# Patient Record
Sex: Female | Born: 1970 | Race: White | Hispanic: No | Marital: Married | Smoking: Former smoker
Health system: Southern US, Community
[De-identification: ages and names within clinical notes are randomized; demographics above are authoritative.]

## PROBLEM LIST (undated history)

## (undated) DIAGNOSIS — K5790 Diverticulosis of intestine, part unspecified, without perforation or abscess without bleeding: Secondary | ICD-10-CM

## (undated) DIAGNOSIS — F329 Major depressive disorder, single episode, unspecified: Secondary | ICD-10-CM

## (undated) DIAGNOSIS — U071 COVID-19: Secondary | ICD-10-CM

## (undated) DIAGNOSIS — K8020 Calculus of gallbladder without cholecystitis without obstruction: Secondary | ICD-10-CM

## (undated) DIAGNOSIS — F419 Anxiety disorder, unspecified: Secondary | ICD-10-CM

## (undated) DIAGNOSIS — J1282 Pneumonia due to coronavirus disease 2019: Secondary | ICD-10-CM

## (undated) DIAGNOSIS — F32A Depression, unspecified: Secondary | ICD-10-CM

## (undated) HISTORY — PX: TUBAL LIGATION: SHX77

## (undated) HISTORY — DX: Depression, unspecified: F32.A

## (undated) HISTORY — DX: Calculus of gallbladder without cholecystitis without obstruction: K80.20

## (undated) HISTORY — PX: TONSILLECTOMY: SUR1361

## (undated) HISTORY — DX: Diverticulosis of intestine, part unspecified, without perforation or abscess without bleeding: K57.90

---
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
## 2001-02-26 ENCOUNTER — Other Ambulatory Visit: Admission: RE | Admit: 2001-02-26 | Discharge: 2001-02-26 | Payer: Self-pay | Admitting: *Deleted

## 2001-04-01 ENCOUNTER — Encounter: Payer: Self-pay | Admitting: *Deleted

## 2001-04-01 ENCOUNTER — Ambulatory Visit (HOSPITAL_COMMUNITY): Admission: RE | Admit: 2001-04-01 | Discharge: 2001-04-01 | Payer: Self-pay | Admitting: *Deleted

## 2001-05-23 ENCOUNTER — Ambulatory Visit (HOSPITAL_COMMUNITY): Admission: RE | Admit: 2001-05-23 | Discharge: 2001-05-23 | Payer: Self-pay | Admitting: *Deleted

## 2001-05-23 ENCOUNTER — Encounter: Payer: Self-pay | Admitting: *Deleted

## 2001-08-27 ENCOUNTER — Inpatient Hospital Stay (HOSPITAL_COMMUNITY): Admission: AD | Admit: 2001-08-27 | Discharge: 2001-08-29 | Payer: Self-pay | Admitting: *Deleted

## 2007-05-26 ENCOUNTER — Emergency Department (HOSPITAL_COMMUNITY): Admission: EM | Admit: 2007-05-26 | Discharge: 2007-05-26 | Payer: Self-pay | Admitting: Emergency Medicine

## 2008-11-11 ENCOUNTER — Ambulatory Visit (HOSPITAL_COMMUNITY): Admission: RE | Admit: 2008-11-11 | Discharge: 2008-11-11 | Payer: Self-pay | Admitting: Obstetrics and Gynecology

## 2010-08-24 ENCOUNTER — Emergency Department (HOSPITAL_COMMUNITY): Payer: 59

## 2010-08-24 ENCOUNTER — Emergency Department (HOSPITAL_COMMUNITY)
Admission: EM | Admit: 2010-08-24 | Discharge: 2010-08-24 | Disposition: A | Discharge status: Home / Self Care | Payer: 59 | Attending: Emergency Medicine | Admitting: Emergency Medicine

## 2010-08-24 DIAGNOSIS — M5412 Radiculopathy, cervical region: Secondary | ICD-10-CM | POA: Insufficient documentation

## 2010-09-11 ENCOUNTER — Other Ambulatory Visit: Payer: Self-pay | Admitting: Family Medicine

## 2010-09-11 DIAGNOSIS — Z1231 Encounter for screening mammogram for malignant neoplasm of breast: Secondary | ICD-10-CM

## 2010-09-15 ENCOUNTER — Ambulatory Visit: Payer: 59

## 2010-09-20 ENCOUNTER — Emergency Department (HOSPITAL_COMMUNITY)
Admission: EM | Admit: 2010-09-20 | Discharge: 2010-09-20 | Disposition: A | Discharge status: Home / Self Care | Payer: 59 | Attending: Emergency Medicine | Admitting: Emergency Medicine

## 2010-09-20 DIAGNOSIS — M5412 Radiculopathy, cervical region: Secondary | ICD-10-CM | POA: Insufficient documentation

## 2010-09-20 DIAGNOSIS — M79609 Pain in unspecified limb: Secondary | ICD-10-CM | POA: Insufficient documentation

## 2010-09-20 LAB — BASIC METABOLIC PANEL
BUN: 8 mg/dL (ref 6–23)
CO2: 26 mEq/L (ref 19–32)
Calcium: 9.2 mg/dL (ref 8.4–10.5)
Chloride: 102 mEq/L (ref 96–112)
Creatinine, Ser: 0.59 mg/dL (ref 0.4–1.2)
GFR calc Af Amer: >60 mL/min (ref >60)
GFR calc non Af Amer: >60 mL/min (ref >60)
Glucose, Bld: 83 mg/dL (ref 70–99)
Potassium: 4.3 mEq/L (ref 3.5–5.1)
Sodium: 137 mEq/L (ref 135–145)

## 2010-10-03 LAB — COMPREHENSIVE METABOLIC PANEL
BUN: 8 mg/dL (ref 6–23)
CO2: 27 mEq/L (ref 19–32)
Calcium: 9.6 mg/dL (ref 8.4–10.5)
Chloride: 103 mEq/L (ref 96–112)
Creatinine, Ser: 0.54 mg/dL (ref 0.4–1.2)
GFR calc non Af Amer: >60 mL/min (ref >60)
Glucose, Bld: 94 mg/dL (ref 70–99)
Total Bilirubin: 0.6 mg/dL (ref 0.3–1.2)

## 2010-10-03 LAB — HCG, QUANTITATIVE, PREGNANCY: hCG, Beta Chain, Quant, S: <2 m[IU]/mL (ref <5)

## 2010-10-03 LAB — CBC
HCT: 38.7 % (ref 36.0–46.0)
MCHC: 36.4 g/dL — ABNORMAL HIGH (ref 30.0–36.0)
MCV: 87.6 fL (ref 78.0–100.0)
RBC: 4.42 MIL/uL (ref 3.87–5.11)
WBC: 6.4 10*3/uL (ref 4.0–10.5)

## 2010-11-07 NOTE — Op Note (Signed)
[name], [name]:  [id number]   MEDICAL RECORD NO.:  [id number]          PATIENT TYPE:  AMB   LOCATION:  DAY                           FACILITY:  [facility]   PHYSICIAN:  Tilda Burrow, M.D. DATE OF BIRTH:  [birth year]   DATE OF PROCEDURE:  11/11/2008  DATE OF DISCHARGE:  11/11/2008                               OPERATIVE REPORT   PREOPERATIVE DIAGNOSIS:  Elective sterilization.   POSTOPERATIVE DIAGNOSIS:  Elective sterilization.   PROCEDURE:  Laparoscopic tubal sterilization with Falope rings.   SURGEON:  Christin Bach, M.D.   ASSISTANTOrson Aloe, CST.   ANESTHESIA:  General.   COMPLICATIONS:  none.   FINDINGS:  Normal tubes, bilateral small simple cysts on the right ovary  considered normal.   DETAILS OF PROCEDURE:  The patient was taken to the operating room,  prepped and draped in the usual standard fashion with legs in low  lithotomy leg supports after general anesthesia was introduced without  difficulty.  The bladder was in-and-out catheterized and Hulka tenaculum  attached to the cervix for uterine manipulation.  An infraumbilical,  vertical, 1-cm skin incision was made as well as a transverse suprapubic  1-cm incision.  A Veress needle was used to achieve pneumoperitoneum  through the umbilical incision while being careful to orient the needle  toward the pelvis while elevating the abdominal wall by manual  elevation.  Water droplet test was used to confirm intraperitoneal  placement.   Pneumoperitoneum was achieved easily under 8-to-10 mm of intra-abdominal  pressure; and the laparoscopic trocar was introduced, a 5-mm blunt  tipped trocar, under direct visualization using the video camera.  Peritoneal cavity was entered without difficulty.  Inspection of the  anterior surfaces of the abdominal contents showed no evidence of injury  or bleeding.  Attention was directed to the pelvis.  Findings were as  described above.   Attention  was first directed to the left fallopian tube which was  elevated, identified to its fimbriated end and grasped in its midportion  with Falope ring applier.  Falope ring applied and then the tube  infiltrated with Marcaine solution 0.25% using a 22-gauge spinal needle  percutaneously applied.   Attention was then directed to the right fallopian tube where a similar  procedure was performed.  Photo documentation of the ring placements was  performed; 120 cc of saline was instilled into the abdomen; deflation of  CO2 performed; instruments removed and subcuticular 4-0 Dexon closure of  skin incisions performed.  The rest of  the surgical instruments were removed; Steri-Strips placed.  The patient  allowed to awaken and go to recovery room in standard fashion.   The patient desires and was given a note allowing her to return to work  in full duty on Nov 14, 2008.      Tilda Burrow, M.D.  Electronically Signed     JVF/MEDQ  D:  11/11/2008  T:  11/12/2008  Job:  [id number]

## 2010-11-10 NOTE — H&P (Signed)
[facility]  Patient:    Kristin Sanders, Kristin Sanders Visit Number: [id number] MRN: [id number]          Service Type: OBS Location: 4A A427 01 Attending Physician:  Jeri Cos. Dictated by:   Langley Gauss, M.D. Admit Date:  08/27/2001 Discharge Date: 08/29/2001   CC:         Dr. Clarisse G[facility]   History and Physical  HISTORY OF PRESENT ILLNESS:  This is a 40 year old gravida 3, para 2 at 57 1/[redacted] weeks gestation who is admitted for induction of labor secondary to psychosocial factors. Apparently the patients husband has just been offered a full-time position, will be in work on September 01, 2001, thus, planned delivery date is scheduled at this time so that the husband may accompany the wife during the course of labor and not have any missed days of work. The patients prenatal course has been complicated only by findings of abnormal 1-hour GGT screening test which was slightly elevated at one hour at 143. A 3-hour GGT thereafter was performed which, however, was noted to be within the normal limits. The patient has had serial ultrasounds which have documented the adequate fetal growth. Initial ultrasound at [redacted] weeks gestation revealed a low lying placenta but a followup ultrasound at [redacted] weeks gestation revealed no evidence of any placenta previa.  PAST MEDICAL HISTORY:  Pertinent for cryosurgery of the cervix in 1988. The patient has had subsequently normal Pap smears since that point in time.  OBSTETRICAL HISTORY:  Pertinent for 1989 vaginal delivery, 5 pound 2 ounce female infant after a 15 hour labor. April of 1993 vaginal delivery at [facility] of a 8 pound 0 ounce female infant after 24 hours of labor. Both of these labor and deliveries were associated with only a 30 minute second stage of labor. The patient denies any problems with the labor or delivery itself.  ALLERGIES:  CODEINE which gives her nausea and  vomiting.  SOCIAL HISTORY:  The patient is not employed outside the home. She does function as a homemaker. Her husband, Onalee Hua, previously was employed by [employer]. The patient is a nonsmoker. She plans on breast-feeding. She would like to utilize Dr. Gabriel Rung from our [facility] for newborn pediatric care. The patient would like to utilize sterilization as a permanent form of birth control. She has been counseled and signed the 30 day waiting papers on May 20, 2001.  OTHER PRENATAL LABS:  O-positive blood type. Negative antibody screen, RPR is nonreactive, hepatitis B is negative.  PHYSICAL EXAMINATION:  GENERAL:  Pleasant and white female.  VITAL SIGNS:  Height 5 feet 6 inches. Prepregnancy weight of 184. Most recent weight is 238. Blood pressure 118/82, pulse rate of 80, respiratory rate is 20.  HEENT:  Negative. No adenopathy.  NECK:  Supple. Thyroid is nonpalpable.  LUNGS:  Clear.  CARDIOVASCULAR:  Regular rate and rhythm.  ABDOMEN:  Soft and nontender. No surgical scars are identified. The patient is known to be a vertex presentation by Leopolds maneuvers.  EXTREMITIES:  Known to be normal.  PELVIC:  Normal external genitalia with no lesions or ulcerations identified. No evidence of any vaginal bleeding, leakage of fluid, or abnormal discharge identified. A sterile digital examination reveals cervix to be 2 cm dilated, 60% effaced, very soft cervix at mid position. Vertex is at -2 station, but it is well applied to the cervix. Fetal heart tones were auscultated in the 150s.  ASSESSMENT:  Term intrauterine pregnancy with very friable cervix. Due to psychosocial factors the patient will be admitted for induction of labor on August 27, 2001, after obtaining a reactive nonstress test will proceed with amniotomy. Thereafter, Pitocin will be initiated if indicated for either induction or augmentation of the labor. Dictated by:   Langley Gauss, M.D. Attending Physician:  Jeri Cos. DD:  08/26/01 TD:  08/26/01 Job: [id number]

## 2010-11-10 NOTE — Group Therapy Note (Signed)
[facility]  Patient:    Kristin Sanders, Kristin Sanders Visit Number: [id number] MRN: [id number]          Service Type: OBS Location: 4A A427 01 Attending Physician:  Jeri Cos. Dictated by:   Langley Gauss, M.D. Proc. Date: 08/27/01 Admit Date:  08/27/2001 Discharge Date: 08/29/2001                               Progress Note  Kristin Sanders is admitted on todays date for induction of labor.  External fetal monitor reveals a reassuring fetal heart rate with no decelerations noted.  There is good long-term variability as well as fetal heart rate accelerations noted.  Patient complained of only a few uterine contractions during the evening.  On external fetal monitor she exhibits no uterine activity, although there is a normal uterine tone upon examination.  Cervix examination is 2 cm dilated, -1 station with a vertex well applied to the cervix.  Cervix is 70% effaced.  Initial attempt at placing the fetal scalp electrode and rupture of membranes is unsuccessful.  On the second attempt fetal scalp electrode is successfully placed with the resultant rupture of membranes and findings of clear amniotic fluid.  Patient is connected to the fetal monitor which continues to reveal a reassuring fetal heart rate.  ASSESSMENT:  Gravida 3, para 2, two prior vaginal deliveries, though patient states they were very long labors as long as 25 hours in duration.  It is apparent from her coach that patient was in prodromal labor for a long period of time and upon reaching the active phase of labor at 4 cm dilatation she progressed very rapidly over the next several hours to complete dilatation and did, in fact, have a short second stage of labor maybe 30 minutes duration. There were no complications with either delivery.  PLAN:  Manage the patient expectantly to observe for the onset of spontaneous labor.  If spontaneous labor does not ensue will augment or induce  with Pitocin as clinically indicated. Dictated by:   Langley Gauss, M.D. Attending Physician:  Jeri Cos. DD:  08/27/01 TD:  08/27/01 Job: [id number]

## 2010-11-10 NOTE — Op Note (Signed)
[facility]  Patient:    Kristin Sanders, Kristin Sanders Visit Number: [id number] MRN: [id number]          Service Type: OBS Location: 4A A427 01 Attending Physician:  Jeri Cos. Dictated by:   Langley Gauss, M.D. Proc. Date: 08/27/01 Admit Date:  08/27/2001   CC:         Dr. ________________   Operative Report  DELIVERY NOTE  DIAGNOSIS:  Thirty-nine-week intrauterine pregnancy for induction of labor.  PROCEDURE: 1. Delivery from spontaneously assisted vaginal delivery, 7 pound 8 ounce    female infant. 2. Midline episiotomy and repair.  SURGEON:  Langley Gauss, M.D.  ESTIMATED BLOOD LOSS:  Less than 500 cc.  COMPLICATIONS:  None.  SPECIMENS:  Arterial cord gas and cord blood to pathology.  The placenta was examined and noted to be apparently intact with a three vessel umbilical cord.  ANALGESIA FOR DELIVERY:  The patient did receive a single dose of 10 mg of IV Nubain during the course of labor.  At the time of delivery, 20 cc of 1% lidocaine was injected in the midline of the perineal body prior to performing episiotomy.  DESCRIPTION OF PROCEDURE:  The patient was admitted for induction of labor. On initial examination, the cervix was 2 cm dilated.  Fetal scalp electrode was placed with a resultant rupture of membranes and findings of clear amniotic fluid.  The fetal scalp electrode documented a reassuring fetal heart rate throughout the course of labor.  The patient did require Pitocin induction.  With onset of uncomfortable uterine contractions and Pitocin at 22 ml per minute, the nursing staff requested placement of intrauterine pressure catheter.  IUPC was placed without difficulty.  The cervix at that time was noted to be 6 cm dilated.  The IUPC documented uterine contractions every three minutes, 50-60 mmHg per contraction with a continuing reassuring fetal heart rate.  The patient did receive a single dose of 10 mg of IV Nubain.  Thereafter the patient progressed rapidly along the labor curve to complete dilatation and had a strong urge to push.  The patient was delivered in the birthing bed in the low lithotomy position.  With distention of the perineum, 20% of 1% lidocaine injected into the midline of the peroneal body. A small midline episiotomy was performed.  The infant was then delivered in a direct OA position over this midline episiotomy without extension.  After delivery of the head, mouth and nares of the infant were bulb suctioned of clear amniotic fluid.  A nuchal cord x1 without compression was reduced prior to delivery of the shoulder.  Spontaneous dilatation occurred through a left anterior shoulder position and gentle abdominal contraction combined with expulsive efforts resulted in delivery of the shoulder over the pubic symphysis without difficulty.  The remainder of the infants body also delivered very easily without difficulty.  The umbilical cord was then moved toward the infant.  The cord was doubly clamped and cut and the infant was handed to the nursing staff for immediate assessment.  Arterial cord gas and arterial blood are then obtained from the placenta.  Gentle traction on the nuchal cord resulted in separation which upon examination appears to be intact placenta with a three vessel umbilical cord.  Examination revealed no lacerations.  The midline episiotomy has not extended.  This is easily repaired using 0 chromic in a running locked fashion in the vaginal mucosa followed by a two layer closure of 0 chromic on the perineal body.  Both mother and infant are doing very well following delivery.  The mother was taken out of the dorsal lithotomy position and allowed to bond with the infant.  The patient does plan on breast feeding.  Pediatric care to be performed by Dr. _____ of MRSA.  She would also like infant circumcision. Dictated by:   Langley Gauss, M.D. Attending Physician:   Jeri Cos. DD:  08/27/01 TD:  08/28/01 Job: [id number]

## 2010-11-10 NOTE — Discharge Summary (Signed)
[facility]  Patient:    Kristin Sanders, Kristin Sanders Visit Number: [id number] MRN: [id number]          Service Type: OBS Location: 4A A427 01 Attending Physician:  Jeri Cos. Dictated by:   Langley Gauss, M.D. Admit Date:  08/27/2001 Discharge Date: 08/29/2001                             Discharge Summary  DIAGNOSIS:  Term pregnancy.  PROCEDURE:  Spontaneous assisted vaginal delivery, 7 pound 13 ounce female infant delivered over an episiotomy with a repair.  Analgesia for delivery is IV Nubain only. The patient is breast-feeding at time of discharge. She is given a copy of standardized discharge instructions. Pediatric care provided by Dr. Gabriel Rung.  PERTINENT LABORATORY DATA:  O-positive blood type. Admission hemoglobin and hematocrit 12.7/36.8 with a white count of 8.0. Postpartum day #1 hemoglobin 11.1, hematocrit 31.2.  HOSPITAL COURSE:  See previous dictating. The patient did well postpartum. She had no postpartum complications, bonded well with the infant, and was breast-feeding without difficulty. Both mother and infant discharged to home on postpartum day #1 1/2 with infant circumcision being performed on day of discharge. Dictated by:   Langley Gauss, M.D. Attending Physician:  Jeri Cos. DD:  09/01/01 TD:  09/02/01 Job: [id number]

## 2015-02-09 ENCOUNTER — Emergency Department (HOSPITAL_COMMUNITY): Payer: Commercial Managed Care - PPO

## 2015-02-09 ENCOUNTER — Encounter (HOSPITAL_COMMUNITY): Payer: Self-pay

## 2015-02-09 ENCOUNTER — Emergency Department (HOSPITAL_COMMUNITY)
Admission: EM | Admit: 2015-02-09 | Discharge: 2015-02-09 | Disposition: A | Discharge status: Home / Self Care | Payer: Commercial Managed Care - PPO | Attending: Physician Assistant | Admitting: Physician Assistant

## 2015-02-09 DIAGNOSIS — Z720 Tobacco use: Secondary | ICD-10-CM | POA: Insufficient documentation

## 2015-02-09 DIAGNOSIS — R197 Diarrhea, unspecified: Secondary | ICD-10-CM | POA: Diagnosis not present

## 2015-02-09 DIAGNOSIS — J209 Acute bronchitis, unspecified: Secondary | ICD-10-CM | POA: Insufficient documentation

## 2015-02-09 DIAGNOSIS — J3489 Other specified disorders of nose and nasal sinuses: Secondary | ICD-10-CM | POA: Insufficient documentation

## 2015-02-09 DIAGNOSIS — J40 Bronchitis, not specified as acute or chronic: Secondary | ICD-10-CM

## 2015-02-09 DIAGNOSIS — H9209 Otalgia, unspecified ear: Secondary | ICD-10-CM | POA: Diagnosis not present

## 2015-02-09 DIAGNOSIS — R05 Cough: Secondary | ICD-10-CM | POA: Diagnosis present

## 2015-02-09 MED ORDER — AZITHROMYCIN 250 MG PO TABS
500.0000 mg | ORAL_TABLET | Freq: Once | ORAL | Status: AC
Start: 2015-02-09 — End: 2015-02-09
  Administered 2015-02-09: 500 mg via ORAL
  Filled 2015-02-09: qty 2

## 2015-02-09 MED ORDER — HYDROCOD POLST-CPM POLST ER 10-8 MG/5ML PO SUER: 5.0000 mL | Freq: Every evening | ORAL | Status: DC | PRN

## 2015-02-09 MED ORDER — HYDROCOD POLST-CPM POLST ER 10-8 MG/5ML PO SUER
5.0000 mL | Freq: Once | ORAL | Status: AC
  Administered 2015-02-09: 5 mL via ORAL
  Filled 2015-02-09: qty 5

## 2015-02-09 MED ORDER — AZITHROMYCIN 250 MG PO TABS: ORAL_TABLET | ORAL | Status: DC

## 2015-02-09 MED ORDER — ALBUTEROL SULFATE HFA 108 (90 BASE) MCG/ACT IN AERS
2.0000 | INHALATION_SPRAY | RESPIRATORY_TRACT | Status: DC | PRN
  Administered 2015-02-09: 2 via RESPIRATORY_TRACT
  Filled 2015-02-09: qty 6.7

## 2015-02-09 NOTE — ED Notes (Signed)
Pt reports scratchy throat, cough, congestion for the past week.  Has been taking otc cough medications without relief.

## 2015-02-09 NOTE — Discharge Instructions (Signed)
Metered Dose Inhaler (No Spacer Used)  Inhaled medicines are the basis of treatment for asthma and other breathing problems. Inhaled medicine can only be effective if used properly. Good technique assures that the medicine reaches the lungs.  Metered dose inhalers (MDIs) are used to deliver a variety of inhaled medicines. These include quick relief or rescue medicines (such as bronchodilators) and controller medicines (such as corticosteroids). The medicine is delivered by pushing down on a metal canister to release a set amount of spray.  If you are using different kinds of inhalers, use your quick relief medicine to open the airways 10-15 minutes before using a steroid, if instructed to do so by your health care provider. If you are unsure which inhalers to use and the order of using them, ask your health care provider, nurse, or respiratory therapist.  HOW TO USE THE INHALER  1. Remove the cap from the inhaler.  2. If you are using the inhaler for the first time, you will need to prime it. Shake the inhaler for 5 seconds and release four puffs into the air, away from your face. Ask your health care provider or pharmacist if you have questions about priming your inhaler.  3. Shake the inhaler for 5 seconds before each breath in (inhalation).  4. Position the inhaler so that the top of the canister faces up.  5. Put your index finger on the top of the medicine canister. Your thumb supports the bottom of the inhaler.  6. Open your mouth.  7. Either place the inhaler between your teeth and place your lips tightly around the mouthpiece, or hold the inhaler 1-2 inches away from your open mouth. If you are unsure of which technique to use, ask your health care provider.  8. Breathe out (exhale) normally and as completely as possible.  9. Press the canister down with the index finger to release the medicine.  10. At the same time as the canister is pressed, inhale deeply and slowly until your lungs are completely filled.  This should take 4-6 seconds. Keep your tongue down.  11. Hold the medicine in your lungs for 5-10 seconds (10 seconds is best). This helps the medicine get into the small airways of your lungs.  12. Breathe out slowly, through pursed lips. Whistling is an example of pursed lips.  13. Wait at least 1 minute between puffs. Continue with the above steps until you have taken the number of puffs your health care provider has ordered. Do not use the inhaler more than your health care provider directs you to.  14. Replace the cap on the inhaler.  15. Follow the directions from your health care provider or the inhaler insert for cleaning the inhaler.  If you are using a steroid inhaler, after your last puff, rinse your mouth with water, gargle, and spit out the water. Do not swallow the water.  AVOID:  · Inhaling before or after starting the spray of medicine. It takes practice to coordinate your breathing with triggering the spray.  · Inhaling through the nose (rather than the mouth) when triggering the spray.  HOW TO DETERMINE IF YOUR INHALER IS FULL OR NEARLY EMPTY  You cannot know when an inhaler is empty by shaking it. Some inhalers are now being made with dose counters. Ask your health care provider for a prescription that has a dose counter if you feel you need that extra help. If your inhaler does not have a counter, ask your health care   provider to help you determine the date you need to refill your inhaler. Write the refill date on a calendar or your inhaler canister. Refill your inhaler 7-10 days before it runs out. Be sure to keep an adequate supply of medicine. This includes making sure it has not expired, and making sure you have a spare inhaler.  SEEK MEDICAL CARE IF:  · Symptoms are only partially relieved with your inhaler.  · You are having trouble using your inhaler.  · You experience an increase in phlegm.  SEEK IMMEDIATE MEDICAL CARE IF:  · You feel little or no relief with your inhalers. You are still  wheezing and feeling shortness of breath, tightness in your chest, or both.  · You have dizziness, headaches, or a fast heart rate.  · You have chills, fever, or night sweats.  · There is a noticeable increase in phlegm production, or there is blood in the phlegm.  MAKE SURE YOU:  · Understand these instructions.  · Will watch your condition.  · Will get help right away if you are not doing well or get worse.  Document Released: 04/08/2007 Document Revised: 10/26/2013 Document Reviewed: 11/27/2012  Exit[employer]® Patient Information ©2015 [employer]. This information is not intended to replace advice given to you by your health care provider. Make sure you discuss any questions you have with your health care provider.

## 2015-02-09 NOTE — ED Provider Notes (Signed)
CSN: [id number]     Arrival date & time 02/09/15  1831 History   First MD Initiated Contact with Patient 02/09/15 1903     Chief Complaint  Patient presents with  . Cough  . Sore Throat     (Consider location/radiation/quality/duration/timing/severity/associated sxs/prior Treatment) Patient is a 44 y.o. female presenting with cough and pharyngitis. The history is provided by the patient.  Cough Cough characteristics:  Productive Sputum characteristics:  Yellow Severity:  Moderate Onset quality:  Gradual Duration:  1 week Timing:  Constant Progression:  Worsening Chronicity:  New Smoker: yes   Relieved by:  Nothing Worsened by:  Lying down Ineffective treatments: OTC cough and congestion med. Associated symptoms: ear pain, myalgias, sinus congestion and sore throat   Sore Throat Associated symptoms include coughing, myalgias and a sore throat.   Kristin Sanders is a 44 y.o. female who is an every day smoker presents to the ED with cough, congestion and sore throat that started a week ago. She states that she has tried to cut back on her smoking but it seem to make the cough worse.   History reviewed. No pertinent past medical history. Past Surgical History  Procedure Laterality Date  . Tonsillectomy     No family history on file. Social History  Substance Use Topics  . Smoking status: Current Every Day Smoker  . Smokeless tobacco: None  . Alcohol Use: Yes     Comment: occ   OB History    No data available     Review of Systems  HENT: Positive for ear pain and sore throat.   Respiratory: Positive for cough.   Gastrointestinal: Diarrhea: loose stools.  Musculoskeletal: Positive for myalgias.  all other systems negative    Allergies  Codeine  Home Medications   Prior to Admission medications   Medication Sig Start Date End Date Taking? Authorizing Provider  azithromycin (ZITHROMAX) 250 MG tablet Take one tablet po daily 02/09/15   Natashia Roseman Bunnie Pion, NP   chlorpheniramine-HYDROcodone [facility] ER) 10-8 MG/5ML SUER Take 5 mLs by mouth at bedtime as needed for cough. 02/09/15   Holley Wirt Bunnie Pion, NP   BP 114/78 mmHg  Pulse 76  Temp(Src) 98.4 F (36.9 C) (Oral)  Resp 20  Ht 5\' 6"  (1.676 m)  Wt 180 lb (81.647 kg)  BMI 29.07 kg/m2  SpO2 100%  LMP 01/18/2015 Physical Exam  Constitutional: She is oriented to person, place, and time. She appears well-developed and well-nourished. No distress.  HENT:  Head: Normocephalic and atraumatic.  Right Ear: Tympanic membrane normal.  Left Ear: Tympanic membrane normal.  Nose: Rhinorrhea present.  Mouth/Throat: Uvula is midline, oropharynx is clear and moist and mucous membranes are normal.  Eyes: EOM are normal.  Neck: Normal range of motion. Neck supple.  Cardiovascular: Normal rate and regular rhythm.   Pulmonary/Chest: Effort normal. No respiratory distress. She has wheezes. She has no rales.  Abdominal: Soft. There is no tenderness.  Musculoskeletal: Normal range of motion.  Neurological: She is alert and oriented to person, place, and time. No cranial nerve deficit.  Skin: Skin is warm and dry.  Psychiatric: She has a normal mood and affect. Her behavior is normal.  Nursing note and vitals reviewed.   ED Course  Procedures (including critical care time) Labs Review Labs Reviewed - No data to display  Imaging Review Dg Chest 2 View  02/09/2015   CLINICAL DATA:  Cough, congestion, shortness of breath  EXAM: CHEST  2 VIEW  COMPARISON:  None.  FINDINGS: The heart size and mediastinal contours are within normal limits. Both lungs are clear. The visualized skeletal structures are unremarkable.  IMPRESSION: No active cardiopulmonary disease.   Electronically Signed   By: Kathreen Devoid   On: 02/09/2015 19:58   I have personally reviewed and evaluated these images and results as part of my medical decision-making.   MDM  44 y.o. female with cough and congestion for the past week. Stable  for d/c without pneumonia on x-ray and O2 SAT 100% on R/A. Will treat for bronchitis and she will follow up with her PCP or return here as needed for worsening symptoms. Patient given Albuterol Inhaler with instructions per respiratory. Z-pack with first dose given here and Tussionex. Discussed with the patient and all questioned fully answered. .   Final diagnoses:  Bronchitis        Ashley Murrain, NP 02/09/15 2035  Courteney Julio Alm, MD 02/10/15 [phone]

## 2015-05-08 ENCOUNTER — Emergency Department (HOSPITAL_COMMUNITY)
Admission: EM | Admit: 2015-05-08 | Discharge: 2015-05-08 | Disposition: A | Discharge status: Home / Self Care | Payer: Commercial Managed Care - PPO | Attending: Emergency Medicine | Admitting: Emergency Medicine

## 2015-05-08 ENCOUNTER — Encounter (HOSPITAL_COMMUNITY): Payer: Self-pay | Admitting: *Deleted

## 2015-05-08 DIAGNOSIS — B349 Viral infection, unspecified: Secondary | ICD-10-CM

## 2015-05-08 DIAGNOSIS — R202 Paresthesia of skin: Secondary | ICD-10-CM | POA: Diagnosis not present

## 2015-05-08 DIAGNOSIS — Z791 Long term (current) use of non-steroidal anti-inflammatories (NSAID): Secondary | ICD-10-CM | POA: Diagnosis not present

## 2015-05-08 DIAGNOSIS — R197 Diarrhea, unspecified: Secondary | ICD-10-CM | POA: Diagnosis present

## 2015-05-08 DIAGNOSIS — F17210 Nicotine dependence, cigarettes, uncomplicated: Secondary | ICD-10-CM | POA: Diagnosis not present

## 2015-05-08 DIAGNOSIS — R030 Elevated blood-pressure reading, without diagnosis of hypertension: Secondary | ICD-10-CM | POA: Insufficient documentation

## 2015-05-08 LAB — URINE MICROSCOPIC-ADD ON

## 2015-05-08 LAB — URINALYSIS, ROUTINE W REFLEX MICROSCOPIC
BILIRUBIN URINE: NEGATIVE
Glucose, UA: NEGATIVE mg/dL
Ketones, ur: NEGATIVE mg/dL
LEUKOCYTES UA: NEGATIVE
NITRITE: NEGATIVE
PH: 6 (ref 5.0–8.0)
Protein, ur: NEGATIVE mg/dL
Urobilinogen, UA: 0.2 mg/dL (ref 0.0–1.0)

## 2015-05-08 MED ORDER — PREDNISONE 10 MG PO TABS: ORAL_TABLET | ORAL | Status: DC

## 2015-05-08 MED ORDER — ALBUTEROL SULFATE HFA 108 (90 BASE) MCG/ACT IN AERS
2.0000 | INHALATION_SPRAY | Freq: Once | RESPIRATORY_TRACT | Status: AC
  Administered 2015-05-08: 2 via RESPIRATORY_TRACT
  Filled 2015-05-08: qty 6.7

## 2015-05-08 MED ORDER — PREDNISONE 50 MG PO TABS
60.0000 mg | ORAL_TABLET | Freq: Once | ORAL | Status: AC
  Administered 2015-05-08: 60 mg via ORAL
  Filled 2015-05-08 (×2): qty 1

## 2015-05-08 NOTE — ED Notes (Signed)
Pt states she has had diarrhea for 2 weeks, denies n/v. Today her RLQ had numbness and tingling, denies any pain.

## 2015-05-08 NOTE — ED Provider Notes (Signed)
History  By signing my name below, I, Kristin Sanders, attest that this documentation has been prepared under the direction and in the presence of Kristin Kocher, PA-C. Electronically Signed: [name], ED Scribe. 05/08/2015. 6:56 PM.  Chief Complaint  Patient presents with  . Diarrhea   The history is provided by the patient and medical records. No language interpreter was used.    HPI Comments:  Kristin Sanders is a 44 y.o. obese female who presents to the Emergency Department complaining of diarrhea that has been ongoing for the past two weeks. Pt reports associated numbness, tingling and itching of the right lower abdomen lateral of the navel. She states the numbness, tingling and itching is now moving to her lower right side of her back. She states she has never had symptoms like this before. She has not seen a doctor for her symptoms and has not done anything to treat them. She denies modifying factors. She denies any trauma, injury or fall. She denies fever, chills, nausea, vomiting, abdominal pain, rash, LLE pain. She denies any recent travel or any new diet changes. She Pt reports that she sleeps on her right side and has been resting a lot lately because she has had some cold symptoms. She states she was here about three months ago and was diagnosed with acute bronchitis and prescribed a Z-Pak. She states she has children at home and could have possibly been exposed to something from them. Pt endorses smoking.  History reviewed. No pertinent past medical history. Past Surgical History  Procedure Laterality Date  . Tonsillectomy    . Tubal ligation     No family history on file. Social History  Substance Use Topics  . Smoking status: Current Every Day Smoker  . Smokeless tobacco: None  . Alcohol Use: Yes     Comment: occ   OB History    No data available     Review of Systems  Constitutional: Negative for fever and chills.  Gastrointestinal: Positive for diarrhea.  Negative for nausea, vomiting and abdominal pain.  Skin: Negative for wound.  Neurological: Positive for numbness (RLE, lower right-sided back).       Tingling and itching of RLQ and right-sided lower back    Allergies  Codeine  Home Medications   Prior to Admission medications   Medication Sig Start Date End Date Taking? Authorizing Provider  azithromycin (ZITHROMAX) 250 MG tablet Take one tablet po daily 02/09/15   Hope Bunnie Pion, NP  chlorpheniramine-HYDROcodone [facility]) 10-8 MG/5ML SUER Take 5 mLs by mouth at bedtime as needed for cough. 02/09/15   Hope Bunnie Pion, NP   Triage Vitals: BP 138/100 mmHg  Pulse 90  Temp(Src) 98.3 F (36.8 C) (Oral)  Resp 18  Ht 5\' 6"  (1.676 m)  Wt 200 lb (90.719 kg)  BMI 32.30 kg/m2  SpO2 99%  LMP 04/07/2015 Physical Exam  Constitutional: She is oriented to person, place, and time. She appears well-developed and well-nourished.  HENT:  Head: Normocephalic and atraumatic.  Nose: Mucosal edema present.  Nasal turbinates edematous.  Eyes: Conjunctivae and EOM are normal. Pupils are equal, round, and reactive to light.  Neck: Normal range of motion.  Cardiovascular: Normal rate, regular rhythm and normal heart sounds.  Exam reveals no gallop and no friction rub.   No murmur heard. Pulmonary/Chest: Effort normal. No respiratory distress.  Course breath sounds at apex of lung. Symmetrical rise and fall of the chest.  Abdominal: Soft. Bowel sounds are normal.  There is no tenderness.  Musculoskeletal: Normal range of motion. She exhibits no edema or tenderness.  No palpable step off of lumbar spine. No pain to percussion of lumbar spine. No paraspinal tenderness. No pain with SLR bilaterally.  Lymphadenopathy:    She has no cervical adenopathy.  Neurological: She is alert and oriented to person, place, and time.  Pt can feel touch and pain but has sensation of tingling during exam.  Skin: Skin is warm and dry. No rash noted.  No rash  on palms or plantar surface of feet. A couple areas from scratching but no blisters or rash noted to right abdomen.  Psychiatric: She has a normal mood and affect. Her behavior is normal.  Nursing note and vitals reviewed.   ED Course  Procedures (including critical care time) DIAGNOSTIC STUDIES: Oxygen Saturation is 99% on RA, normal by my interpretation.   COORDINATION OF CARE: 6:45 PM- Reassured pt that her exam is normal. Advised pt to use OTC antidiarrheal medication and stay hydrated with fluids. Will prescribe Prednisone dose pack and MDI. Pt verbalizes understanding and agrees to plan.  Medications - No data to display  Labs Review Labs Reviewed  LIPASE, BLOOD  COMPREHENSIVE METABOLIC PANEL  CBC  URINALYSIS, ROUTINE W REFLEX MICROSCOPIC (NOT AT [facility])    MDM  The blood pressure is slightly elevated at 138/100, otherwise the vital signs are within normal limits. Pulse oximetry is 99% on room air. Within normal limits by my interpretation. The examination was a viral illness with upper respiratory symptoms, and diarrhea. The patient also complains of a paresthesia involving the right flank and lower abdomen area. I find no sisters or rash concerning for shingles. There is no problem with the straight leg raise, percussion of the lumbar spine, or range of motion of the lower back to suggest nerve involvement. There's been no trauma or operation of this area. Suspect this may be related to position of sleep and rest in a repeated fashion. I have asked the patient to observe this area for any changes, particularly any new rashes. All. I also asked her to observe her wrists patterns in her sleep position. The patient is given an albuterol inhaler, and the prednisone taper. The patient is asked to increase fluids. She has had diarrhea for nearly 2 weeks, and I have asked her to use antidiarrheal medications for 3 days only. The patient is to return to the emergency department or see her  primary physician if not improving.    Final diagnoses:  None    *I have reviewed nursing notes, vital signs, and all appropriate lab and imaging results for this patient.**  I personally performed the services described in this documentation, which was scribed in my presence. The recorded information has been reviewed and is accurate.    Kristin Kocher, PA-C 05/08/15 1914  Dorie Rank, MD 05/12/15 1247

## 2015-05-08 NOTE — Discharge Instructions (Signed)
Your examination suggest a viral illness. Please use albuterol 2 puffs  every 4 hours, and prednisone taper as prescribed. Viral Infections A virus is a type of germ. Viruses can cause:  Minor sore throats.  Aches and pains.  Headaches.  Runny nose.  Rashes.  Watery eyes.  Tiredness.  Coughs.  Loss of appetite.  Feeling sick to your stomach (nausea).  Throwing up (vomiting).  Watery poop (diarrhea). HOME CARE   Only take medicines as told by your doctor.  Drink enough water and fluids to keep your pee (urine) clear or pale yellow. Sports drinks are a good choice.  Get plenty of rest and eat healthy. Soups and broths with crackers or rice are fine. GET HELP RIGHT AWAY IF:   You have a very bad headache.  You have shortness of breath.  You have chest pain or neck pain.  You have an unusual rash.  You cannot stop throwing up.  You have watery poop that does not stop.  You cannot keep fluids down.  You or your child has a temperature by mouth above 102 F (38.9 C), not controlled by medicine.  Your baby is older than 3 months with a rectal temperature of 102 F (38.9 C) or higher.  Your baby is 34 months old or younger with a rectal temperature of 100.4 F (38 C) or higher. MAKE SURE YOU:   Understand these instructions.  Will watch this condition.  Will get help right away if you are not doing well or get worse.   This information is not intended to replace advice given to you by your health care provider. Make sure you discuss any questions you have with your health care provider.   Document Released: 05/24/2008 Document Revised: 09/03/2011 Document Reviewed: 11/17/2014 Elsevier Interactive Patient Education 2016 Else[employer]  Please increase fluids. Please, wash hands frequently. Please use Imodium one or 2 tablets daily for 3 days only. Please insert for changes in the tingling sensation in your right lower abdomen area. I suspect that this may  be related to your position of rest or sleep. Please see her primary physician or return to the emergency department if this condition changes.

## 2016-03-25 ENCOUNTER — Emergency Department (HOSPITAL_COMMUNITY): Payer: Commercial Managed Care - PPO

## 2016-03-25 ENCOUNTER — Emergency Department (HOSPITAL_COMMUNITY)
Admission: EM | Admit: 2016-03-25 | Discharge: 2016-03-25 | Disposition: A | Discharge status: Home / Self Care | Payer: Commercial Managed Care - PPO | Attending: Emergency Medicine | Admitting: Emergency Medicine

## 2016-03-25 ENCOUNTER — Encounter (HOSPITAL_COMMUNITY): Payer: Self-pay

## 2016-03-25 DIAGNOSIS — Z87891 Personal history of nicotine dependence: Secondary | ICD-10-CM | POA: Diagnosis not present

## 2016-03-25 DIAGNOSIS — Y929 Unspecified place or not applicable: Secondary | ICD-10-CM | POA: Insufficient documentation

## 2016-03-25 DIAGNOSIS — Y999 Unspecified external cause status: Secondary | ICD-10-CM | POA: Insufficient documentation

## 2016-03-25 DIAGNOSIS — S6992XA Unspecified injury of left wrist, hand and finger(s), initial encounter: Secondary | ICD-10-CM | POA: Diagnosis present

## 2016-03-25 DIAGNOSIS — S63622A Sprain of interphalangeal joint of left thumb, initial encounter: Secondary | ICD-10-CM | POA: Insufficient documentation

## 2016-03-25 DIAGNOSIS — Y939 Activity, unspecified: Secondary | ICD-10-CM | POA: Insufficient documentation

## 2016-03-25 DIAGNOSIS — X501XXA Overexertion from prolonged static or awkward postures, initial encounter: Secondary | ICD-10-CM | POA: Diagnosis not present

## 2016-03-25 MED ORDER — NAPROXEN 500 MG PO TABS: 500.0000 mg | ORAL_TABLET | Freq: Two times a day (BID) | ORAL | 0 refills | Status: DC

## 2016-03-25 NOTE — ED Triage Notes (Signed)
Complains of left thumb pain x1 month. States she has been wearing brace but no relief. Denies injury.

## 2016-03-25 NOTE — Discharge Instructions (Signed)
Keep the thumb splinted.  Call Dr. Ruthe Mannan office to arrange a follow-up

## 2016-03-28 NOTE — ED Provider Notes (Signed)
[facility] DEPT Provider Note   CSN: [id number] Arrival date & time: 03/25/16  1900     History   Chief Complaint Chief Complaint  Patient presents with  . Hand Pain    HPI Kristin Sanders is a 45 y.o. female.  HPI   Kristin Sanders is a 45 y.o. female who presents to the Emergency Department complaining of chronic pain to her left thumb.  She reports having an injury one month ago with a "bending back" of her thumb.  She has been wearing a splint to her thumb without relief.  She describes a throbbing pain that has been constant.  She denies wrist pain, redness, numbness or swelling.    History reviewed. No pertinent past medical history.  There are no active problems to display for this patient.   Past Surgical History:  Procedure Laterality Date  . TONSILLECTOMY    . TUBAL LIGATION      OB History    No data available       Home Medications    Prior to Admission medications   Medication Sig Start Date End Date Taking? Authorizing Provider  azithromycin (ZITHROMAX) 250 MG tablet Take one tablet po daily 02/09/15   Hope Bunnie Pion, NP  chlorpheniramine-HYDROcodone [facility]) 10-8 MG/5ML SUER Take 5 mLs by mouth at bedtime as needed for cough. 02/09/15   Bicknell, NP  naproxen (NAPROSYN) 500 MG tablet Take 1 tablet (500 mg total) by mouth 2 (two) times daily with a meal. 03/25/16   Christinna Sprung, PA-C  predniSONE (DELTASONE) 10 MG tablet 5,4,3,2,1 - take with food 05/08/15   Lily Kocher, PA-C    Family History No family history on file.  Social History Social History  Substance Use Topics  . Smoking status: Former Research scientist (life sciences)  . Smokeless tobacco: Never Used  . Alcohol use Yes     Comment: occ     Allergies   Codeine   Review of Systems Review of Systems  Constitutional: Negative for chills and fever.  Musculoskeletal: Positive for arthralgias (left thumb pain). Negative for joint swelling.  Skin: Negative for color change and  wound.  All other systems reviewed and are negative.    Physical Exam Updated Vital Signs BP 116/74 (BP Location: Right Arm)   Pulse 84   Temp 97.5 F (36.4 C) (Temporal)   Resp 15   Ht 5\' 6"  (1.676 m)   Wt 90.7 kg   LMP 03/25/2016   SpO2 99%   BMI 32.28 kg/m   Physical Exam  Constitutional: She is oriented to person, place, and time. She appears well-developed and well-nourished. No distress.  HENT:  Head: Normocephalic and atraumatic.  Cardiovascular: Normal rate, regular rhythm and intact distal pulses.   Pulmonary/Chest: Effort normal and breath sounds normal.  Musculoskeletal: She exhibits tenderness. She exhibits no edema.  ttp of the proximal left thumb and thenar eminence.   Radial pulse is brisk, distal sensation intact.  CR< 2 sec.  No bruising, erythema or edema or bony deformity.   Wrist NT  Neurological: She is alert and oriented to person, place, and time. She exhibits normal muscle tone. Coordination normal.  Skin: Skin is warm and dry.  Nursing note and vitals reviewed.    ED Treatments / Results  Labs (all labs ordered are listed, but only abnormal results are displayed) Labs Reviewed - No data to display  EKG  EKG Interpretation None       Radiology Dg Finger  Thumb Left  Result Date: 03/25/2016 CLINICAL DATA:  Left thumb pain for 1 month.  No known injury. EXAM: LEFT THUMB 2+V COMPARISON:  None. FINDINGS: The mineralization and alignment are normal. There is no evidence of acute fracture or dislocation. Minimal interphalangeal joint space narrowing. No focal soft tissue abnormalities. IMPRESSION: No acute osseous findings. Electronically Signed   By: Richardean Sale M.D.   On: 03/25/2016 19:41    Procedures Procedures (including critical care time)  Medications Ordered in ED Medications - No data to display   Initial Impression / Assessment and Plan / ED Course  I have reviewed the triage vital signs and the nursing notes.  Pertinent labs  & imaging results that were available during my care of the patient were reviewed by me and considered in my medical decision making (see chart for details).  Clinical Course    XR neg for fx.  NV intact.  Likely ligamentous injury.  Thumb spica splint applied.  Pt agrees to close ortho f/u.    Final Clinical Impressions(s) / ED Diagnoses   Final diagnoses:  Sprain of interphalangeal joint of left thumb, initial encounter    New Prescriptions Discharge Medication List as of 03/25/2016  8:32 PM    START taking these medications   Details  naproxen (NAPROSYN) 500 MG tablet Take 1 tablet (500 mg total) by mouth 2 (two) times daily with a meal., Starting Sun 03/25/2016, Print         [employer], PA-C 03/28/16 [place], MD 03/28/16 2248

## 2016-06-28 IMAGING — DX DG CHEST 2V
2 series · 2 of 2 positions shown · non-contrast
Comparison: None.

CLINICAL DATA: Cough, congestion, shortness of breath

EXAM:
CHEST  2 VIEW

[chest pa]
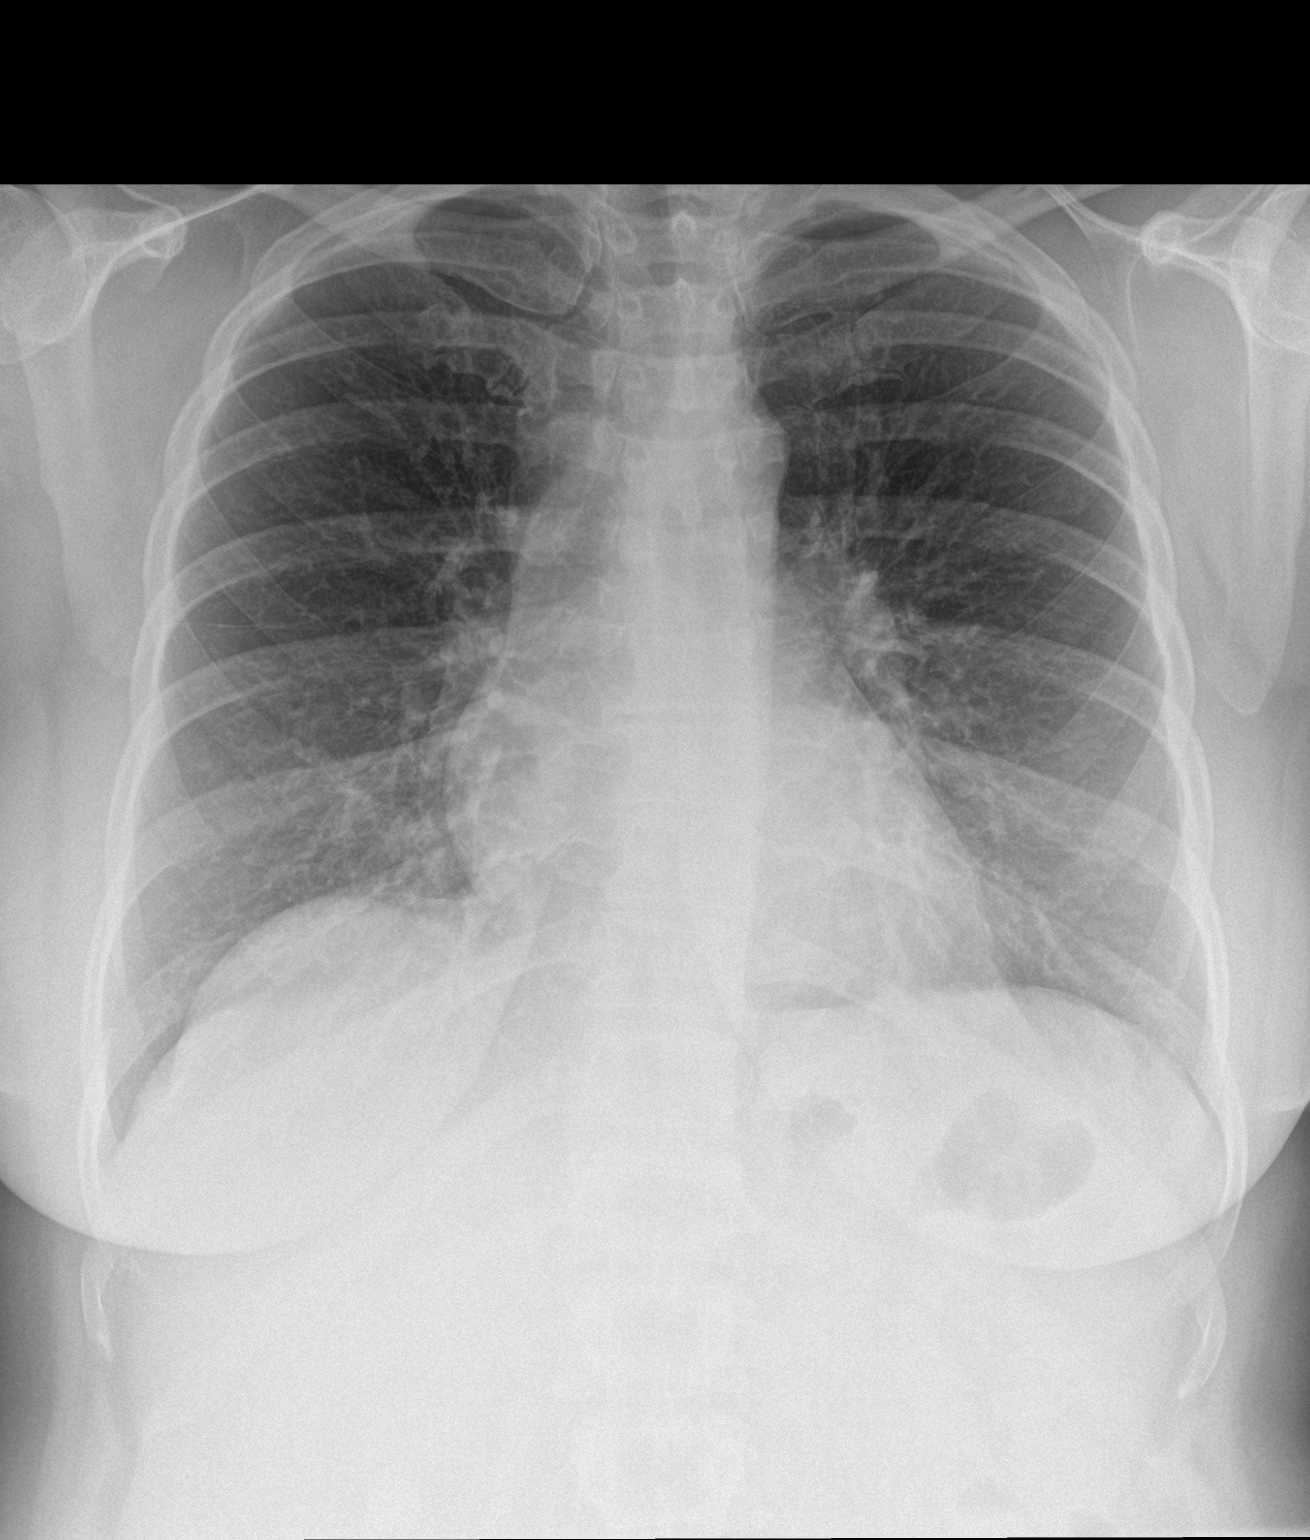

[chest lat]
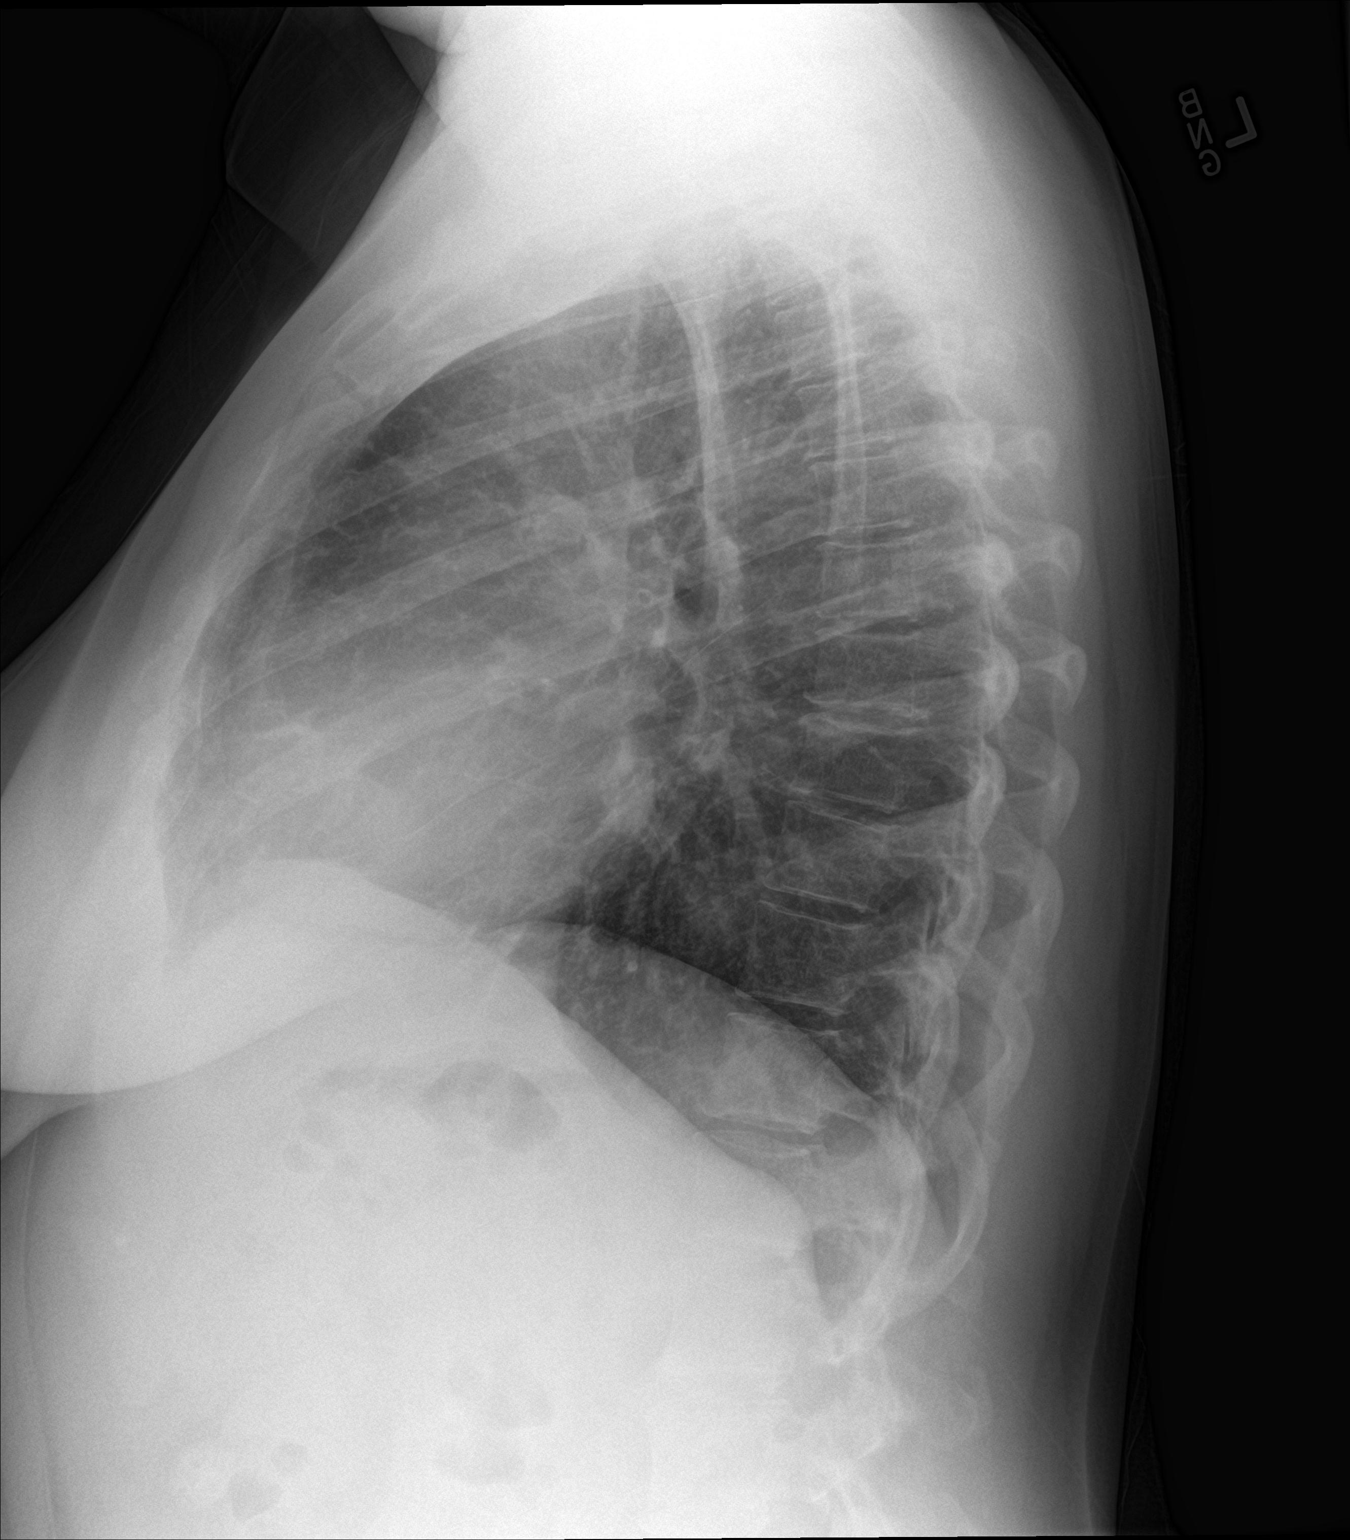

[2 of 2 positions shown; findings below may reference images not displayed]

FINDINGS: The heart size and mediastinal contours are within normal limits.
Both lungs are clear. The visualized skeletal structures are
unremarkable.
IMPRESSION: No active cardiopulmonary disease.

## 2016-12-04 ENCOUNTER — Telehealth (HOSPITAL_COMMUNITY): Payer: Self-pay | Admitting: *Deleted

## 2016-12-04 NOTE — Telephone Encounter (Signed)
Phone call regarding an appointment. voice mailbox not set up.
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
## 2016-12-17 ENCOUNTER — Ambulatory Visit (INDEPENDENT_AMBULATORY_CARE_PROVIDER_SITE_OTHER): Payer: Commercial Managed Care - PPO | Admitting: Psychiatry

## 2016-12-17 ENCOUNTER — Encounter (INDEPENDENT_AMBULATORY_CARE_PROVIDER_SITE_OTHER): Payer: Self-pay

## 2016-12-17 ENCOUNTER — Encounter (HOSPITAL_COMMUNITY): Payer: Self-pay | Admitting: Psychiatry

## 2016-12-17 VITALS — BP 115/74 | HR 64 | Ht 66.0 in | Wt 198.0 lb

## 2016-12-17 DIAGNOSIS — F419 Anxiety disorder, unspecified: Secondary | ICD-10-CM | POA: Diagnosis not present

## 2016-12-17 DIAGNOSIS — Z87891 Personal history of nicotine dependence: Secondary | ICD-10-CM

## 2016-12-17 DIAGNOSIS — D239 Other benign neoplasm of skin, unspecified: Secondary | ICD-10-CM | POA: Diagnosis not present

## 2016-12-17 DIAGNOSIS — F411 Generalized anxiety disorder: Secondary | ICD-10-CM | POA: Insufficient documentation

## 2016-12-17 NOTE — Patient Instructions (Addendum)
1. Continue lexapro 10 mg daily 2. Return to clinic as needed 3. Referral to a therapist in this clinic

## 2016-12-18 ENCOUNTER — Ambulatory Visit (INDEPENDENT_AMBULATORY_CARE_PROVIDER_SITE_OTHER): Payer: Commercial Managed Care - PPO | Admitting: Licensed Clinical Social Worker

## 2016-12-18 DIAGNOSIS — F4323 Adjustment disorder with mixed anxiety and depressed mood: Secondary | ICD-10-CM

## 2016-12-18 DIAGNOSIS — Z630 Problems in relationship with spouse or partner: Secondary | ICD-10-CM

## 2016-12-19 ENCOUNTER — Encounter (HOSPITAL_COMMUNITY): Payer: Self-pay | Admitting: Licensed Clinical Social Worker
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
## 2017-01-01 ENCOUNTER — Ambulatory Visit (HOSPITAL_COMMUNITY): Payer: Self-pay | Admitting: Licensed Clinical Social Worker

## 2019-06-24 ENCOUNTER — Telehealth: Payer: Self-pay | Admitting: Adult Health

## 2019-06-24 NOTE — Telephone Encounter (Signed)
Called patient regarding appointment scheduled in our office and advised to come alone to the visit, however, a support person, over age 48, may accompany her to appointment if assistance is needed for safety or care concerns. Otherwise, support persons should remain outside until the visit is complete.   Prescreen questions asked: 1. Any of the following symptoms of COVID such as chills, fever, cough, shortness of breath, muscle pain, diarrhea, rash, vomiting, abdominal pain, red eye, weakness, bruising, bleeding, joint pain, loss of taste or smell, a severe headache, sore throat, fatigue 2. Any exposure to anyone suspected or confirmed of having COVID-19 3. Awaiting test results for COVID-19  Also,to keep you safe, please use the provided hand sanitizer when you enter the office. We are asking everyone in the office to wear a mask to help prevent the spread of germs. If you have a mask of your own, please wear it to your appointment, if not, we are happy to provide one for you.  Thank you for understanding and your cooperation.    [facility] Staff      

## 2019-06-29 ENCOUNTER — Encounter: Payer: Self-pay | Admitting: Adult Health

## 2019-06-29 ENCOUNTER — Other Ambulatory Visit: Payer: Self-pay

## 2019-06-29 ENCOUNTER — Ambulatory Visit: Payer: Self-pay | Admitting: Adult Health

## 2019-06-29 VITALS — BP 131/86 | HR 79 | Ht 66.0 in | Wt 214.0 lb

## 2019-06-29 DIAGNOSIS — N939 Abnormal uterine and vaginal bleeding, unspecified: Secondary | ICD-10-CM

## 2019-06-29 DIAGNOSIS — F329 Major depressive disorder, single episode, unspecified: Secondary | ICD-10-CM

## 2019-06-29 DIAGNOSIS — N951 Menopausal and female climacteric states: Secondary | ICD-10-CM | POA: Insufficient documentation

## 2019-06-29 DIAGNOSIS — F32A Depression, unspecified: Secondary | ICD-10-CM

## 2019-06-29 DIAGNOSIS — Z3202 Encounter for pregnancy test, result negative: Secondary | ICD-10-CM

## 2019-06-29 LAB — POCT HEMOGLOBIN: Hemoglobin: 14.6 g/dL (ref 11–14.6)

## 2019-06-29 LAB — POCT URINE PREGNANCY: Preg Test, Ur: NEGATIVE

## 2019-06-29 MED ORDER — NORETHINDRONE ACETATE 5 MG PO TABS: 5.0000 mg | ORAL_TABLET | Freq: Every day | ORAL | 3 refills | Status: DC

## 2019-06-29 MED ORDER — ESTRADIOL 1 MG PO TABS: 1.0000 mg | ORAL_TABLET | Freq: Every day | ORAL | 3 refills | Status: DC

## 2019-06-29 NOTE — Progress Notes (Signed)
Patient ID: Kristin Sanders, female   DOB: September 24, 1970, 49 y.o.   MRN: [id number] History of Present Illness: Kristin Sanders is a 49 year old white female, married, G3P3 in complaining of different periods since June.She says she bleed 3 weeks in June and then periods regular but shorter, 1 day heavy with clots then light then stops, has low back and pelvic pain with periods and feels more tired and depressed then, was on lexapro but stopped.  PCP is [facility].   Current Medications, Allergies, Past Medical History, Past Surgical History, Family History and Social History were reviewed in [facility] record.     Review of Systems: Patient denies any headaches, hearing loss, blurred vision, shortness of breath, chest pain,  problems with bowel movements, urination, or intercourse. No joint pain or mood swings. Has had different cycles since June, when bleed 3 weeks then lately periods shorter, heavy 1 days then light, having clots and low back and pelvic pain with cycle.Feels more tired and depressed with periods, was on lexapro but stopped. +hari loss but had normal TSH she said.   Physical Exam:BP 131/86 (BP Location: Left Arm, Patient Position: Sitting, Cuff Size: Large)   Pulse 79   Ht 5\' 6"  (1.676 m)   Wt 214 lb (97.1 kg)   LMP 06/22/2019 (Approximate)   BMI 34.54 kg/m   UPT is negative HGB 14.6 General:  Well developed, well nourished, no acute distress Skin:  Warm and dry Neck:  Midline trachea, normal thyroid, good ROM, no lymphadenopathy Lungs; Clear to auscultation bilaterally Cardiovascular: Regular rate and rhythm No CVAT or back pain with palpation  Pelvic:  External genitalia is normal in appearance, no lesions.  The vagina is normal in appearance. Urethra has no lesions or masses. The cervix is bulbous.  Uterus is felt to be normal size, shape, and contour.  No adnexal masses or tenderness noted.Bladder is non tender, no masses felt. Extremities/musculoskeletal:   No swelling or varicosities noted, no clubbing or cyanosis Psych:  No mood changes, alert and cooperative,seems happy Fall risk is low PHQ 9 score is 9, denies any SI or HI. Examination chaperoned by Kristin Pupa LPN  Impression and Plan: 1. Pregnancy examination or test, negative result   2. Abnormal vaginal bleeding Discussed probably related to menopause Will request records form [facility] [place], had pap and [place] there in last 2 years   3. Peri-menopause Will try HRT if she can afford, is self pay  Discussed risks and benefits Review handout on menopause and HRT  Meds ordered this encounter  Medications  . estradiol (ESTRACE) 1 MG tablet    Sig: Take 1 tablet (1 mg total) by mouth daily.    Dispense:  30 tablet    Refill:  3    Order Specific Question:   Supervising Provider    Answer:   Elonda Husky, LUTHER H [[id number]]  . norethindrone (AYGESTIN) 5 MG tablet    Sig: Take 1 tablet (5 mg total) by mouth daily.    Dispense:  30 tablet    Refill:  3    Order Specific Question:   Supervising Provider    Answer:   Elonda Husky, LUTHER H [[id number]]    4. Depression, unspecified depression type She declines meds at this time

## 2019-06-29 NOTE — Patient Instructions (Addendum)
Menopause and Hormone Replacement Therapy Menopause is a normal time of life when menstrual periods stop completely and the ovaries stop producing the female hormones estrogen and progesterone. This lack of hormones can affect your health and cause undesirable symptoms. Hormone replacement therapy (HRT) can relieve some of those symptoms. What is hormone replacement therapy? HRT is the use of artificial (synthetic) hormones to replace hormones that your body has stopped producing because you have reached menopause. What are my options for HRT?  HRT may consist of the synthetic hormones estrogen and progestin, or it may consist of only estrogen (estrogen-only therapy). You and your health care provider will decide which form of HRT is best for you. If you choose to be on HRT and you have a uterus, estrogen and progestin are usually prescribed. Estrogen-only therapy is used for women who do not have a uterus. Possible options for taking HRT include:  Pills.  Patches.  Gels.  Sprays.  Vaginal cream.  Vaginal rings.  Vaginal inserts. The amount of hormone(s) that you take and how long you take the hormone(s) varies according to your health. It is important to:  Begin HRT with the lowest possible dosage.  Stop HRT as soon as your health care provider tells you to stop.  Work with your health care provider so that you feel informed and comfortable with your decisions. What are the benefits of HRT? HRT can reduce the frequency and severity of menopausal symptoms. Benefits of HRT vary according to the kind of symptoms that you have, how severe they are, and your overall health. HRT may help to improve the following symptoms of menopause:  Hot flashes and night sweats. These are sudden feelings of heat that spread over the face and body. The skin may turn red, like a blush. Night sweats are hot flashes that happen while you are sleeping or trying to sleep.  Bone loss (osteoporosis). The  body loses calcium more quickly after menopause, causing the bones to become weaker. This can increase the risk for bone breaks (fractures).  Vaginal dryness. The lining of the vagina can become thin and dry, which can cause pain during sex or cause infection, burning, or itching.  Urinary tract infections.  Urinary incontinence. This is the inability to control when you pass urine.  Irritability.  Short-term memory problems. What are the risks of HRT? Risks of HRT vary depending on your individual health and medical history. Risks of HRT also depend on whether you receive both estrogen and progestin or you receive estrogen only. HRT may increase the risk of:  Spotting. This is when a small amount of blood leaks from the vagina unexpectedly.  Endometrial cancer. This cancer is in the lining of the uterus (endometrium).  Breast cancer.  Increased density of breast tissue. This can make it harder to find breast cancer on a breast X-ray (mammogram).  Stroke.  Heart disease.  Blood clots.  Gallbladder disease.  Liver disease. Risks of HRT can increase if you have any of the following conditions:  Endometrial cancer.  Liver disease.  Heart disease.  Breast cancer.  History of blood clots.  History of stroke. Follow these instructions at home:  Take over-the-counter and prescription medicines only as told by your health care provider.  Get mammograms, pelvic exams, and medical checkups as often as told by your health care provider.  Have Pap tests done as often as told by your health care provider. A Pap test is sometimes called a Pap smear. It   is a screening test that is used to check for signs of cancer of the cervix and vagina. A Pap test can also identify the presence of infection or precancerous changes. Pap tests may be done: ? Every 3 years, starting at age 21. ? Every 5 years, starting after age 30, in combination with testing for human papillomavirus  (HPV). ? More often or less often depending on other medical conditions you have, your age, and other risk factors.  It is up to you to get the results of your Pap test. Ask your health care provider, or the department that is doing the test, when your results will be ready.  Keep all follow-up visits as told by your health care provider. This is important. Contact a health care provider if you have:  Pain or swelling in your legs.  Shortness of breath.  Chest pain.  Lumps or changes in your breasts or armpits.  Slurred speech.  Pain, burning, or bleeding when you urinate.  Unusual vaginal bleeding.  Dizziness or headaches.  Weakness or numbness in any part of your arms or legs.  Pain in your abdomen. Summary  Menopause is a normal time of life when menstrual periods stop completely and the ovaries stop producing the female hormones estrogen and progesterone.  Hormone replacement therapy (HRT) can relieve some of the symptoms of menopause.  HRT can reduce the frequency and severity of menopausal symptoms.  Risks of HRT vary depending on your individual health and medical history. This information is not intended to replace advice given to you by your health care provider. Make sure you discuss any questions you have with your health care provider. Document Revised: 02/11/2018 Document Reviewed: 02/11/2018 Elsevier Patient Education  2020 [employer] Menopause Menopause is the normal time of life when menstrual periods stop completely. It is usually confirmed by 12 months without a menstrual period. The transition to menopause (perimenopause) most often happens between the ages of 45 and 55. During perimenopause, hormone levels change in your body, which can cause symptoms and affect your health. Menopause may increase your risk for:  Loss of bone (osteoporosis), which causes bone breaks (fractures).  Depression.  Hardening and narrowing of the arteries  (atherosclerosis), which can cause heart attacks and strokes. What are the causes? This condition is usually caused by a natural change in hormone levels that happens as you get older. The condition may also be caused by surgery to remove both ovaries (bilateral oophorectomy). What increases the risk? This condition is more likely to start at an earlier age if you have certain medical conditions or treatments, including:  A tumor of the pituitary gland in the brain.  A disease that affects the ovaries and hormone production.  Radiation treatment for cancer.  Certain cancer treatments, such as chemotherapy or hormone (anti-estrogen) therapy.  Heavy smoking and excessive alcohol use.  Family history of early menopause. This condition is also more likely to develop earlier in women who are very thin. What are the signs or symptoms? Symptoms of this condition include:  Hot flashes.  Irregular menstrual periods.  Night sweats.  Changes in feelings about sex. This could be a decrease in sex drive or an increased comfort around your sexuality.  Vaginal dryness and thinning of the vaginal walls. This may cause painful intercourse.  Dryness of the skin and development of wrinkles.  Headaches.  Problems sleeping (insomnia).  Mood swings or irritability.  Memory problems.  Weight gain.  Hair growth on the face   and chest.  Bladder infections or problems with urinating. How is this diagnosed? This condition is diagnosed based on your medical history, a physical exam, your age, your menstrual history, and your symptoms. Hormone tests may also be done. How is this treated? In some cases, no treatment is needed. You and your health care provider should make a decision together about whether treatment is necessary. Treatment will be based on your individual condition and preferences. Treatment for this condition focuses on managing symptoms. Treatment may include:  Menopausal  hormone therapy (MHT).  Medicines to treat specific symptoms or complications.  Acupuncture.  Vitamin or herbal supplements. Before starting treatment, make sure to let your health care provider know if you have a personal or family history of:  Heart disease.  Breast cancer.  Blood clots.  Diabetes.  Osteoporosis. Follow these instructions at home: Lifestyle  Do not use any products that contain nicotine or tobacco, such as cigarettes and e-cigarettes. If you need help quitting, ask your health care provider.  Get at least 30 minutes of physical activity on 5 or more days each week.  Avoid alcoholic and caffeinated beverages, as well as spicy foods. This may help prevent hot flashes.  Get 7-8 hours of sleep each night.  If you have hot flashes, try: ? Dressing in layers. ? Avoiding things that may trigger hot flashes, such as spicy food, warm places, or stress. ? Taking slow, deep breaths when a hot flash starts. ? Keeping a fan in your home and office.  Find ways to manage stress, such as deep breathing, meditation, or journaling.  Consider going to group therapy with other women who are having menopause symptoms. Ask your health care provider about recommended group therapy meetings. Eating and drinking  Eat a healthy, balanced diet that contains whole grains, lean protein, low-fat dairy, and plenty of fruits and vegetables.  Your health care provider may recommend adding more soy to your diet. Foods that contain soy include tofu, tempeh, and soy milk.  Eat plenty of foods that contain calcium and vitamin D for bone health. Items that are rich in calcium include low-fat milk, yogurt, beans, almonds, sardines, broccoli, and kale. Medicines  Take over-the-counter and prescription medicines only as told by your health care provider.  Talk with your health care provider before starting any herbal supplements. If prescribed, take vitamins and supplements as told by your  health care provider. These may include: ? Calcium. Women age 51 and older should get 1,200 mg (milligrams) of calcium every day. ? Vitamin D. Women need 600-800 International Units of vitamin D each day. ? Vitamins B12 and B6. Aim for 50 micrograms of B12 and 1.5 mg of B6 each day. General instructions  Keep track of your menstrual periods, including: ? When they occur. ? How heavy they are and how long they last. ? How much time passes between periods.  Keep track of your symptoms, noting when they start, how often you have them, and how long they last.  Use vaginal lubricants or moisturizers to help with vaginal dryness and improve comfort during sex.  Keep all follow-up visits as told by your health care provider. This is important. This includes any group therapy or counseling. Contact a health care provider if:  You are still having menstrual periods after age 55.  You have pain during sex.  You have not had a period for 12 months and you develop vaginal bleeding. Get help right away if:  You have: ?   Severe depression. ? Excessive vaginal bleeding. ? Pain when you urinate. ? A fast or irregular heart beat (palpitations). ? Severe headaches. ? Abdomen (abdominal) pain or severe indigestion.  You fell and you think you have a broken bone.  You develop leg or chest pain.  You develop vision problems.  You feel a lump in your breast. Summary  Menopause is the normal time of life when menstrual periods stop completely. It is usually confirmed by 12 months without a menstrual period.  The transition to menopause (perimenopause) most often happens between the ages of 45 and 55.  Symptoms can be managed through medicines, lifestyle changes, and complementary therapies such as acupuncture.  Eat a balanced diet that is rich in nutrients to promote bone health and heart health and to manage symptoms during menopause. This information is not intended to replace advice given  to you by your health care provider. Make sure you discuss any questions you have with your health care provider. Document Revised: 05/24/2017 Document Reviewed: 07/14/2016 Elsevier Patient Education  2020 Elsevier Inc.  

## 2019-07-30 ENCOUNTER — Telehealth: Payer: Self-pay | Admitting: *Deleted

## 2019-07-30 MED ORDER — ESCITALOPRAM OXALATE 10 MG PO TABS: 10.0000 mg | ORAL_TABLET | Freq: Every day | ORAL | 6 refills | Status: DC

## 2019-07-30 NOTE — Telephone Encounter (Signed)
Pt not comfortable with taking hormones. Would like Lexapro sent in.

## 2019-07-30 NOTE — Telephone Encounter (Signed)
Will rx lexapro ?

## 2019-10-05 ENCOUNTER — Ambulatory Visit: Payer: Self-pay | Admitting: Adult Health

## 2020-04-17 ENCOUNTER — Encounter (HOSPITAL_COMMUNITY): Payer: Self-pay | Admitting: *Deleted

## 2020-04-17 ENCOUNTER — Emergency Department (HOSPITAL_COMMUNITY)
Admission: EM | Admit: 2020-04-17 | Discharge: 2020-04-17 | Disposition: A | Discharge status: Home / Self Care | Payer: HRSA Program | Attending: Emergency Medicine | Admitting: Emergency Medicine

## 2020-04-17 DIAGNOSIS — U071 COVID-19: Secondary | ICD-10-CM | POA: Diagnosis not present

## 2020-04-17 DIAGNOSIS — Z87891 Personal history of nicotine dependence: Secondary | ICD-10-CM | POA: Diagnosis not present

## 2020-04-17 DIAGNOSIS — R0602 Shortness of breath: Secondary | ICD-10-CM | POA: Diagnosis present

## 2020-04-17 NOTE — Discharge Instructions (Signed)
You may stop taking the albuterol.  This can give you palpitations and anxiety.  If you should develop severe or worsening symptoms please return to the emergency department immediately  Please continue to quarantine until instructed by the health department

## 2020-04-17 NOTE — ED Triage Notes (Signed)
States she was diagnosed with covid positive, states she was informed she had pneumonia 4 days ago, states when she uses her inhaler she feels short of breath

## 2020-04-17 NOTE — ED Provider Notes (Signed)
[facility] EMERGENCY DEPARTMENT Provider Note   CSN: [id number] Arrival date & time: 04/17/20  1127     History Chief Complaint  Patient presents with  . Covid Positive    Kristin Sanders is a 49 y.o. female.  HPI   Pleasant 49 year old female, no prior history of lung disease presents after developing Covid.  She has been symptomatic for 9 days, she tested positive approximately 5 days ago, had an emergency department visit 3 days ago and was prescribed a combination of Zithromax albuterol and Zofran.  Her diarrhea and nausea has resolved, her cough and shortness of breath is improved but she notes that every time she uses the albuterol she gets palpitations and anxiety feeling.  She does not feel it at this time and feels well  Past Medical History:  Diagnosis Date  . Depression     Patient Active Problem List   Diagnosis Date Noted  . Peri-menopause 06/29/2019  . Abnormal vaginal bleeding 06/29/2019  . Pregnancy examination or test, negative result 06/29/2019  . Depression 06/29/2019  . GAD (generalized anxiety disorder) 12/17/2016    Past Surgical History:  Procedure Laterality Date  . TONSILLECTOMY    . TUBAL LIGATION       OB History    Gravida  3   Para  3   Term  3   Preterm      AB      Living  3     SAB      TAB      Ectopic      Multiple      Live Births  3           Family History  Problem Relation Age of Onset  . COPD Maternal Grandmother   . Alzheimer's disease Father   . Hypertension Mother   . Drug abuse Brother   . Depression Sister   . Hypertension Sister     Social History   Tobacco Use  . Smoking status: Former Research scientist (life sciences)  . Smokeless tobacco: Never Used  Vaping Use  . Vaping Use: Every day  Substance Use Topics  . Alcohol use: Yes    Comment: occ, 12-17-2016 Per pt occa  . Drug use: No    Home Medications Prior to Admission medications   Medication Sig Start Date End Date Taking? Authorizing Provider    Biotin 10000 MCG TABS Take by mouth.    [provider]  escitalopram (LEXAPRO) 10 MG tablet Take 1 tablet (10 mg total) by mouth daily. 07/30/19 07/29/20  Estill Dooms, NP  Multiple Vitamin (MULTIVITAMIN) capsule Take 1 capsule by mouth daily.    [provider]    Allergies    Codeine  Review of Systems   Review of Systems  All other systems reviewed and are negative.   Physical Exam Updated Vital Signs BP 128/86 (BP Location: Right Arm)   Pulse 94   Temp 97.8 F (36.6 C)   Resp 18   Ht 1.676 m (5\' 6" )   Wt 86.2 kg   SpO2 98%   BMI 30.67 kg/m   Physical Exam Vitals and nursing note reviewed.  Constitutional:      General: She is not in acute distress.    Appearance: She is well-developed.  HENT:     Head: Normocephalic and atraumatic.     Mouth/Throat:     Pharynx: No oropharyngeal exudate.  Eyes:     General: No scleral icterus.  Right eye: No discharge.        Left eye: No discharge.     Conjunctiva/sclera: Conjunctivae normal.     Pupils: Pupils are equal, round, and reactive to light.  Neck:     Thyroid: No thyromegaly.     Vascular: No JVD.  Cardiovascular:     Rate and Rhythm: Normal rate and regular rhythm.     Heart sounds: Normal heart sounds. No murmur heard.  No friction rub. No gallop.   Pulmonary:     Effort: Pulmonary effort is normal. No respiratory distress.     Breath sounds: Normal breath sounds. No wheezing or rales.  Abdominal:     General: Bowel sounds are normal. There is no distension.     Palpations: Abdomen is soft. There is no mass.     Tenderness: There is no abdominal tenderness.  Musculoskeletal:        General: No tenderness. Normal range of motion.     Cervical back: Normal range of motion and neck supple.  Lymphadenopathy:     Cervical: No cervical adenopathy.  Skin:    General: Skin is warm and dry.     Findings: No erythema or rash.  Neurological:     Mental Status: She is alert.      Coordination: Coordination normal.  Psychiatric:        Behavior: Behavior normal.     ED Results / Procedures / Treatments   Labs (all labs ordered are listed, but only abnormal results are displayed) Labs Reviewed - No data to display  EKG EKG Interpretation  Date/Time:  Sunday April 17 2020 12:27:54 EDT Ventricular Rate:  83 PR Interval:    QRS Duration: 97 QT Interval:  382 QTC Calculation: 447 R Axis:   63 Text Interpretation: Sinus rhythm No old tracing to compare Confirmed by Noemi Chapel [id number] on 04/17/2020 12:33:02 PM   Radiology No results found.  Procedures Procedures (including critical care time)  Medications Ordered in ED Medications - No data to display  ED Course  I have reviewed the triage vital signs and the nursing notes.  Pertinent labs & imaging results that were available during my care of the patient were reviewed by me and considered in my medical decision making (see chart for details).    MDM Rules/Calculators/A&P                          This patient is unlabored in her breathing, the lungs are clear without rales, oxygen is 98% on room air and she is not tachycardic febrile or hypotensive.  At this time she is well-appearing, I suspect that her palpitations and anxiety are related to the albuterol as she states that it gets better when she stops using the medicine and she is symptom-free at this time.  Will hold albuterol, follow-up as needed, quarantine recommended, patient stable for discharge and agreeable to the plan  Final Clinical Impression(s) / ED Diagnoses Final diagnoses:  COVID-19    Rx / DC Orders ED Discharge Orders    None       Noemi Chapel, MD 04/17/20 1247

## 2020-07-04 ENCOUNTER — Other Ambulatory Visit: Payer: Self-pay

## 2020-07-04 ENCOUNTER — Encounter (HOSPITAL_COMMUNITY): Payer: Self-pay

## 2020-07-04 ENCOUNTER — Emergency Department (HOSPITAL_COMMUNITY): Payer: Self-pay

## 2020-07-04 ENCOUNTER — Emergency Department (HOSPITAL_COMMUNITY)
Admission: EM | Admit: 2020-07-04 | Discharge: 2020-07-04 | Disposition: A | Discharge status: Home / Self Care | Payer: Self-pay | Attending: Emergency Medicine | Admitting: Emergency Medicine

## 2020-07-04 DIAGNOSIS — Z79899 Other long term (current) drug therapy: Secondary | ICD-10-CM | POA: Insufficient documentation

## 2020-07-04 DIAGNOSIS — J40 Bronchitis, not specified as acute or chronic: Secondary | ICD-10-CM

## 2020-07-04 DIAGNOSIS — J209 Acute bronchitis, unspecified: Secondary | ICD-10-CM | POA: Insufficient documentation

## 2020-07-04 DIAGNOSIS — Z20822 Contact with and (suspected) exposure to covid-19: Secondary | ICD-10-CM | POA: Insufficient documentation

## 2020-07-04 DIAGNOSIS — Z87891 Personal history of nicotine dependence: Secondary | ICD-10-CM | POA: Insufficient documentation

## 2020-07-04 HISTORY — DX: Pneumonia due to coronavirus disease 2019: J12.82

## 2020-07-04 HISTORY — DX: Anxiety disorder, unspecified: F41.9

## 2020-07-04 HISTORY — DX: COVID-19: U07.1

## 2020-07-04 LAB — BASIC METABOLIC PANEL
Anion gap: 8 (ref 5–15)
BUN: 9 mg/dL (ref 6–20)
CO2: 24 mmol/L (ref 22–32)
Calcium: 9.2 mg/dL (ref 8.9–10.3)
Chloride: 107 mmol/L (ref 98–111)
Creatinine, Ser: 0.59 mg/dL (ref 0.44–1.00)
GFR, Estimated: >60 mL/min (ref >60)
Glucose, Bld: 124 mg/dL — ABNORMAL HIGH (ref 70–99)
Potassium: 3.9 mmol/L (ref 3.5–5.1)
Sodium: 139 mmol/L (ref 135–145)

## 2020-07-04 LAB — CBC
HCT: 44.1 % (ref 36.0–46.0)
Hemoglobin: 14.8 g/dL (ref 12.0–15.0)
MCH: 31.3 pg (ref 26.0–34.0)
MCHC: 33.6 g/dL (ref 30.0–36.0)
MCV: 93.2 fL (ref 80.0–100.0)
Platelets: 272 K/uL (ref 150–400)
RBC: 4.73 MIL/uL (ref 3.87–5.11)
RDW: 11.9 % (ref 11.5–15.5)
WBC: 6.2 K/uL (ref 4.0–10.5)
nRBC: 0 % (ref 0.0–0.2)

## 2020-07-04 LAB — POC URINE PREG, ED: Preg Test, Ur: NEGATIVE

## 2020-07-04 LAB — SARS CORONAVIRUS 2 (TAT 6-24 HRS): SARS Coronavirus 2: NEGATIVE

## 2020-07-04 LAB — TROPONIN I (HIGH SENSITIVITY)
Troponin I (High Sensitivity): <2 ng/L
Troponin I (High Sensitivity): <2 ng/L (ref <18)

## 2020-07-04 NOTE — ED Provider Notes (Signed)
[facility] EMERGENCY DEPARTMENT Provider Note   CSN: [id number] Arrival date & time: 07/04/20  0605   Time seen 8:39 AM  History Chief Complaint  Patient presents with  . Chest Pain    Kristin Sanders is a 50 y.o. female.  HPI   Patient states she started getting chest pain on January 5.  She states the pain comes and goes.  She describes it as a heaviness.  It last for a few minutes.  She states it is mainly in the morning when she is coughing up a lot of mucus which also started on the same day.  She states it is clear and she is not having fever.  She denies sore throat, rhinorrhea, nausea, or vomiting.  She has had 2 episodes of posttussive vomiting from coughing so hard.  She denies wheezing.  She denies being around anybody else who is sick.  She states her husband states she snores a lot and sometimes she seems to stop breathing.  This has been going on for a while.  She was advised to see her primary care doctor about getting a sleep study to check her for sleep apnea.  Patient states she had COVID in October.  She states she had double pneumonia at that time.  Patient states she takes no medications.  She states her father had an MI and her paternal grandfather died of a stroke.  She has no other risk factors.  Patient has not taken a COVID-vaccine  PCP Practice, [facility]   Past Medical History:  Diagnosis Date  . Anxiety   . Depression   . Pneumonia due to COVID-19 virus     Patient Active Problem List   Diagnosis Date Noted  . Peri-menopause 06/29/2019  . Abnormal vaginal bleeding 06/29/2019  . Pregnancy examination or test, negative result 06/29/2019  . Depression 06/29/2019  . GAD (generalized anxiety disorder) 12/17/2016    Past Surgical History:  Procedure Laterality Date  . TONSILLECTOMY    . TUBAL LIGATION       OB History    Gravida  3   Para  3   Term  3   Preterm      AB      Living  3     SAB      IAB      Ectopic       Multiple      Live Births  3           Family History  Problem Relation Age of Onset  . COPD Maternal Grandmother   . Alzheimer's disease Father   . Hypertension Mother   . Drug abuse Brother   . Depression Sister   . Hypertension Sister     Social History   Tobacco Use  . Smoking status: Former Research scientist (life sciences)  . Smokeless tobacco: Never Used  Vaping Use  . Vaping Use: Every day  Substance Use Topics  . Alcohol use: Yes    Comment: occ, 12-17-2016 Per pt occa  . 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 Authorizing Provider  Biotin 10000 MCG TABS Take by mouth.    [provider]  escitalopram (LEXAPRO) 10 MG tablet Take 1 tablet (10 mg total) by mouth daily. 07/30/19 07/29/20  Estill Dooms, NP  Multiple Vitamin (MULTIVITAMIN) capsule Take 1 capsule by mouth daily.    [provider]    Allergies    Codeine  Review of Systems   Review of Systems  All other systems reviewed and are negative.   Physical Exam Updated Vital Signs BP (!) 129/95   Pulse 82   Temp 98.2 F (36.8 C) (Oral)   Resp 19   Ht 5\' 6"  (1.676 m)   Wt 90.7 kg   LMP 06/09/2020   SpO2 99%   BMI 32.28 kg/m   Physical Exam Vitals and nursing note reviewed.  Constitutional:      Appearance: Normal appearance. She is obese.  HENT:     Head: Normocephalic and atraumatic.     Right Ear: External ear normal.     Left Ear: External ear normal.  Eyes:     Extraocular Movements: Extraocular movements intact.     Conjunctiva/sclera: Conjunctivae normal.     Pupils: Pupils are equal, round, and reactive to light.  Cardiovascular:     Rate and Rhythm: Normal rate and regular rhythm.     Pulses: Normal pulses.     Heart sounds: Normal heart sounds.  Pulmonary:     Effort: Pulmonary effort is normal. No respiratory distress.     Breath sounds: Normal breath sounds.  Chest:      Chest wall: No tenderness.  Musculoskeletal:        General: No swelling. Normal range of motion.     Cervical back: Normal range of motion and neck supple.  Skin:    General: Skin is warm and dry.  Neurological:     General: No focal deficit present.     Mental Status: She is alert and oriented to person, place, and time.     Cranial Nerves: No cranial nerve deficit.  Psychiatric:        Mood and Affect: Mood normal.        Behavior: Behavior normal.        Thought Content: Thought content normal.     ED Results / Procedures / Treatments   Labs (all labs ordered are listed, but only abnormal results are displayed) Results for orders placed or performed during the hospital encounter of 29/52/84  Basic metabolic panel  Result Value Ref Range   Sodium 139 135 - 145 mmol/L   Potassium 3.9 3.5 - 5.1 mmol/L   Chloride 107 98 - 111 mmol/L   CO2 24 22 - 32 mmol/L   Glucose, Bld 124 (H) 70 - 99 mg/dL   BUN 9 6 - 20 mg/dL   Creatinine, Ser 0.59 0.44 - 1.00 mg/dL   Calcium 9.2 8.9 - 10.3 mg/dL   GFR, Estimated >60 >60 mL/min   Anion gap 8 5 - 15  CBC  Result Value Ref Range   WBC 6.2 4.0 - 10.5 K/uL   RBC 4.73 3.87 - 5.11 MIL/uL   Hemoglobin 14.8 12.0 - 15.0 g/dL   HCT 44.1 36.0 - 46.0 %   MCV 93.2 80.0 - 100.0 fL   MCH 31.3 26.0 - 34.0 pg   MCHC 33.6 30.0 - 36.0 g/dL   RDW 11.9 11.5 - 15.5 %   Platelets 272 150 - 400 K/uL   nRBC 0.0 0.0 - 0.2 %  POC urine preg, ED  Result Value Ref Range   Preg Test, Ur NEGATIVE NEGATIVE  Troponin I (High Sensitivity)  Result Value Ref Range   Troponin I (High Sensitivity) <2 <18 ng/L   Laboratory interpretation all normal except nonfasting hyperglycemia    EKG EKG  Interpretation  Date/Time:  Monday July 04 2020 07:53:32 EST Ventricular Rate:  72 PR Interval:    QRS Duration: 95 QT Interval:  385 QTC Calculation: 422 R Axis:   36 Text Interpretation: Sinus rhythm No significant change since last tracing 17 Apr 2020  Confirmed by Rolland Porter [phone]) on 07/04/2020 8:14:30 AM   Radiology DG Chest Port 1 View  Result Date: 07/04/2020 CLINICAL DATA:  Chest pain and tachycardia EXAM: PORTABLE CHEST 1 VIEW COMPARISON:  April 14, 2020 FINDINGS: Lungs are clear. Heart size and pulmonary vascularity are normal. No adenopathy. No pneumothorax. No bone lesions. IMPRESSION: Lungs clear.  Cardiac silhouette within normal limits. Electronically Signed   By: Lowella Grip III M.D.   On: 07/04/2020 08:28    Procedures Procedures (including critical care time)  Medications Ordered in ED Medications - No data to display  ED Course  I have reviewed the triage vital signs and the nursing notes.  Pertinent labs & imaging results that were available during my care of the patient were reviewed by me and considered in my medical decision making (see chart for details).    MDM Rules/Calculators/A&P                           Patient presents with atypical chest discomfort.  It is worse in the morning when she is coughing up a lot of mucus.  She also reports the day the pain started she was helping her husband install some very heavy windows and she has to pick the windows up and sit them inside the window frame and her husband is on the outside and installs them in place.  However when I press on her chest she is nontender.  Patient's labs have resulted and are normal.  She is anxious to be discharged and was discharged home.   Final Clinical Impression(s) / ED Diagnoses Final diagnoses:  Suspected COVID-19 virus infection  Bronchitis    Rx / DC Orders ED Discharge Orders    None     Plan discharge  Rolland Porter, MD, Barbette Or, MD 07/04/20 [phone]

## 2020-07-04 NOTE — Discharge Instructions (Addendum)
Drink plenty of fluids.  Take ibuprofen 600 mg plus acetaminophen 650 mg every 6 hours as needed for chest pain or body aches.  Take Mucinex DM over-the-counter for cough.  You should quarantine until you get your COVID test result back.  If your COVID test is negative, you are having a viral bronchitis.  Let your doctor know if it is lasting more than 7-10 more days at that point they may consider putting you on antibiotics.  If your COVID test is positive you need to remain in quarantine for at least another 5 days and start taking zinc 50 mg once a day, vitamin D and vitamin C daily, and aspirin 81 mg daily.  Return to the emergency department if you are struggling to breathe.

## 2020-07-04 NOTE — ED Triage Notes (Signed)
Pt presents to ED with complaints of feeling like heart is racing, tightness in mid chest, and hypertension started Wednesday. Pt states she also has chest congestion.

## 2021-01-02 ENCOUNTER — Telehealth: Payer: Self-pay | Admitting: Adult Health

## 2021-01-02 NOTE — Telephone Encounter (Signed)
Had period last month and yesterday had brown spotting but on steroids and Z pack, just watch for now

## 2021-01-02 NOTE — Telephone Encounter (Signed)
Patient called stating that she would like to speak with Anderson [place], Patient states she does not want to waste an appointment slot if she does not have to. Patient did not state the reason for the call. Please contact pt

## 2021-03-03 ENCOUNTER — Other Ambulatory Visit: Payer: Self-pay

## 2021-03-03 ENCOUNTER — Ambulatory Visit (INDEPENDENT_AMBULATORY_CARE_PROVIDER_SITE_OTHER): Payer: 59 | Admitting: Pulmonary Disease

## 2021-03-03 ENCOUNTER — Encounter: Payer: Self-pay | Admitting: Pulmonary Disease

## 2021-03-03 VITALS — BP 124/70 | HR 73 | Temp 98.0°F | Ht 66.0 in | Wt 228.1 lb

## 2021-03-03 DIAGNOSIS — R053 Chronic cough: Secondary | ICD-10-CM | POA: Diagnosis not present

## 2021-03-03 DIAGNOSIS — R0683 Snoring: Secondary | ICD-10-CM | POA: Diagnosis not present

## 2021-03-03 DIAGNOSIS — R0602 Shortness of breath: Secondary | ICD-10-CM | POA: Diagnosis not present

## 2021-03-03 DIAGNOSIS — G4733 Obstructive sleep apnea (adult) (pediatric): Secondary | ICD-10-CM

## 2021-03-03 NOTE — Progress Notes (Signed)
Subjective:    Patient ID: Kristin Sanders, female    DOB: 1971/01/14, 50 y.o.   MRN: [id number]  HPI  Chief Complaint  Patient presents with   Consult    Patient is coming in because she had covid last year OCT 2021 and doesnt feel back at baseline. She states that sometimes it feels that she is needing to sit down to recover but she is fine when working out at [employer]. Does cough up mucus clear. Does snore. Has woke up a few times needing to catch her breath after sleeping.     Ambi is a 50 year old ex-smoker who presents for evaluation of sleep disordered breathing and chest congestion. She smoked more than 25 pack years until she quit 2018 and then started vaping.  She had COVID infection 03/2020 with mild hypoxia did not require hospitalization.  Since then she reports increased chest congestion and constant throat clearing.  She was treated for an acute bronchitis 12/2020 by her PCP with a prednisone Dosepak and a Z-Pak.  Her husband tested positive for COVID then but she tested negative x2.  She also reports hypersomnolence and loud snoring has been noted by family members.  She reports gasping and choking episodes that wake her up from sleep. Epworth sleepiness score is 18 and she reports some somnolence while sitting and reading, watching TV, as a passenger in a car or sitting quietly after lunch. Bedtime is between 10 PM and midnight, there is a TV in the bedroom, sleep latency is minimal, she sleeps on her right side with 1 pillow, reports 2-3 nocturnal awakenings including nocturia and is out of bed anywhere between 5 and 8 AM with dryness of mouth but denies headaches. She has gained about 20 pounds in the last 2 years and also reports premenopausal symptoms.  She attributes the weight gain to Lexapro  There is no history suggestive of cataplexy, sleep paralysis or parasomnias  Chest x-ray 07/04/2020 appears clear of infiltrates      Past Medical History:  Diagnosis Date    Anxiety    Depression    Pneumonia due to COVID-19 virus    Past Surgical History:  Procedure Laterality Date   TONSILLECTOMY     TUBAL LIGATION      Allergies  Allergen Reactions   Codeine     Social History   Socioeconomic History   Marital status: Married    Spouse name: Not on file   Number of children: Not on file   Years of education: Not on file   Highest education level: Not on file  Occupational History   Not on file  Tobacco Use   Smoking status: Former   Smokeless tobacco: Never  Vaping Use   Vaping Use: Every day  Substance and Sexual Activity   Alcohol use: Yes    Comment: occ, 12-17-2016 Per pt occa   Drug use: No   Sexual activity: Yes    Birth control/protection: Surgical    Comment: tubal  Other Topics Concern   Not on file  Social History Narrative   Not on file   Social Determinants of Health   Financial Resource Strain: Not on file  Food Insecurity: Not on file  Transportation Needs: Not on file  Physical Activity: Not on file  Stress: Not on file  Social Connections: Not on file  Intimate Partner Violence: Not on file     Review of Systems  Shortness of breath with activity Acid heartburn and  indigestion Weight gain  Anxiety depression Feet swelling Joint stiffness   Constitutional: negative for anorexia, fevers and sweats  Eyes: negative for irritation, redness and visual disturbance  Ears, nose, mouth, throat, and face: negative for earaches, epistaxis, nasal congestion and sore throat  Respiratory: negative for  sputum and wheezing  Cardiovascular: negative for chest pain,   lower extremity edema, orthopnea, palpitations and syncope  Gastrointestinal: negative for abdominal pain, constipation, diarrhea, melena, nausea and vomiting  Genitourinary:negative for dysuria, frequency and hematuria  Hematologic/lymphatic: negative for bleeding, easy bruising and lymphadenopathy  Musculoskeletal:negative for arthralgias, muscle  weakness  Neurological: negative for coordination problems, gait problems, headaches and weakness  Endocrine: negative for diabetic symptoms including polydipsia, polyuria and weight loss     Objective:   Physical Exam  Gen. Pleasant, obese, in no distress, normal affect ENT - no pallor,icterus, no post nasal drip, class 2-3 airway Neck: No JVD, no thyromegaly, no carotid bruits Lungs: no use of accessory muscles, no dullness to percussion, decreased without rales or rhonchi  Cardiovascular: Rhythm regular, heart sounds  normal, no murmurs or gallops, no peripheral edema Abdomen: soft and non-tender, no hepatosplenomegaly, BS normal. Musculoskeletal: No deformities, no cyanosis or clubbing Neuro:  alert, non focal, no tremors       Assessment & Plan:

## 2021-03-03 NOTE — Assessment & Plan Note (Signed)
Her chronic chest congestion may be related to vaping or post COVID symptoms.  This appears to be resolving so does not really need intensive treatment I will asked her to use Mucinex on an as-needed basis.. We will obtain spirometry pre and post to rule out airway obstruction given her long history of smoking in the past

## 2021-03-03 NOTE — Patient Instructions (Addendum)
Home sleep test Spirometry pre- post  Mucinex as needed for congestion

## 2021-03-03 NOTE — Assessment & Plan Note (Signed)
Given excessive daytime somnolence, narrow pharyngeal exam, witnessed apneas & loud snoring, obstructive sleep apnea is very likely & an overnight polysomnogram will be scheduled as a home study. The pathophysiology of obstructive sleep apnea , it's cardiovascular consequences & modes of treatment including CPAP were discused with the patient in detail & they evidenced understanding. Pretest probability is intermediate.  She has claustrophobia and has some reservations about using a CPAP machine would be willing to use nasal pillows or nasal mask if absolutely needed.

## 2021-05-24 ENCOUNTER — Ambulatory Visit: Payer: 59 | Admitting: Adult Health

## 2021-05-30 ENCOUNTER — Other Ambulatory Visit: Payer: Self-pay

## 2021-05-30 ENCOUNTER — Ambulatory Visit (INDEPENDENT_AMBULATORY_CARE_PROVIDER_SITE_OTHER): Payer: 59 | Admitting: Adult Health

## 2021-05-30 ENCOUNTER — Encounter: Payer: Self-pay | Admitting: Adult Health

## 2021-05-30 VITALS — BP 145/81 | HR 94 | Ht 66.0 in | Wt 225.0 lb

## 2021-05-30 DIAGNOSIS — N926 Irregular menstruation, unspecified: Secondary | ICD-10-CM | POA: Insufficient documentation

## 2021-05-30 DIAGNOSIS — R140 Abdominal distension (gaseous): Secondary | ICD-10-CM | POA: Diagnosis not present

## 2021-05-30 DIAGNOSIS — N951 Menopausal and female climacteric states: Secondary | ICD-10-CM | POA: Diagnosis not present

## 2021-05-30 DIAGNOSIS — N3946 Mixed incontinence: Secondary | ICD-10-CM

## 2021-05-30 DIAGNOSIS — R4589 Other symptoms and signs involving emotional state: Secondary | ICD-10-CM | POA: Insufficient documentation

## 2021-05-30 DIAGNOSIS — R6881 Early satiety: Secondary | ICD-10-CM

## 2021-05-30 DIAGNOSIS — R351 Nocturia: Secondary | ICD-10-CM | POA: Insufficient documentation

## 2021-05-30 NOTE — Progress Notes (Signed)
  Subjective:     Patient ID: Kristin Sanders, female   DOB: 1970-10-06, 50 y.o.   MRN: [id number]  HPI Kristin Sanders is a 49 year old white female, married, G3P3 in complaining of irregular periods, vaginal dryness, clots, moody and bloated. She had pap in May with PCP was normal she says. She had labs too.  She says she did cologuard too.  PCP is [facility].  Review of Systems +irregular periods + clots +bloating +early satiety +weight gain +moody ?hot flashes, gets hot Pees 2-3 x at times and has UI  Reviewed past medical,surgical, social and family history. Reviewed medications and allergies.     Objective:   Physical Exam BP (!) 145/81 (BP Location: Right Arm, Patient Position: Sitting, Cuff Size: Large)   Pulse 94   Ht 5\' 6"  (1.676 m)   Wt 225 lb (102.1 kg)   LMP 05/25/2021   BMI 36.32 kg/m     Skin warm and dry.Pelvic: external genitalia is normal in appearance no lesions, vagina: pink and slightly dry,urethra has no lesions or masses noted, cervix:smooth and bulbous, uterus: normal size, shape and contour, non tender, no masses felt, adnexa: no masses or tenderness noted. Bladder is non tender and no masses felt.   [facility] - 05/30/21 1407       Pregnancy Intention Screening   Does the patient want to become pregnant in the next year? No    Does the patient's partner want to become pregnant in the next year? No    Would the patient like to discuss contraceptive options today? No      Contraception Wrap Up   Current Method Female Sterilization    End Method Female Sterilization    Contraception Counseling Provided No            Examination chaperoned by Celene Squibb LPN  Assessment:     1. Irregular bleeding Will get pelvic US 06/06/21 at 3:30 pm at [facility], and talk when results back  - US PELVIC COMPLETE WITH TRANSVAGINAL; Future  2. Abdominal bloating - US PELVIC COMPLETE WITH TRANSVAGINAL; Future  3. Peri-menopause   4. Moody   5. Nocturia   6.  Mixed incontinence urge and stress   7. Early satiety     Plan:     Requests copy of labs and pap from PCP Will talk when [place] back

## 2021-06-06 ENCOUNTER — Other Ambulatory Visit: Payer: Self-pay

## 2021-06-06 ENCOUNTER — Ambulatory Visit (HOSPITAL_COMMUNITY)
Admission: RE | Admit: 2021-06-06 | Discharge: 2021-06-06 | Disposition: A | Discharge status: Home / Self Care | Payer: 59 | Source: Ambulatory Visit | Attending: Adult Health | Admitting: Adult Health

## 2021-06-06 DIAGNOSIS — R140 Abdominal distension (gaseous): Secondary | ICD-10-CM | POA: Insufficient documentation

## 2021-06-06 DIAGNOSIS — N926 Irregular menstruation, unspecified: Secondary | ICD-10-CM | POA: Diagnosis present

## 2021-07-13 ENCOUNTER — Ambulatory Visit: Payer: 59 | Admitting: Pulmonary Disease

## 2021-08-09 ENCOUNTER — Other Ambulatory Visit: Payer: Self-pay

## 2021-08-09 ENCOUNTER — Ambulatory Visit: Payer: 59

## 2021-08-09 DIAGNOSIS — G4733 Obstructive sleep apnea (adult) (pediatric): Secondary | ICD-10-CM

## 2021-08-09 DIAGNOSIS — R0683 Snoring: Secondary | ICD-10-CM

## 2021-08-11 ENCOUNTER — Other Ambulatory Visit (HOSPITAL_COMMUNITY)
Admission: RE | Admit: 2021-08-11 | Discharge: 2021-08-11 | Disposition: A | Discharge status: Home / Self Care | Payer: 59 | Source: Ambulatory Visit | Attending: Pulmonary Disease | Admitting: Pulmonary Disease

## 2021-08-11 DIAGNOSIS — Z01812 Encounter for preprocedural laboratory examination: Secondary | ICD-10-CM | POA: Diagnosis present

## 2021-08-11 DIAGNOSIS — Z20822 Contact with and (suspected) exposure to covid-19: Secondary | ICD-10-CM | POA: Diagnosis not present

## 2021-08-11 DIAGNOSIS — Z01818 Encounter for other preprocedural examination: Secondary | ICD-10-CM

## 2021-08-11 LAB — SARS CORONAVIRUS 2 (TAT 6-24 HRS): SARS Coronavirus 2: NEGATIVE

## 2021-08-14 ENCOUNTER — Inpatient Hospital Stay (HOSPITAL_COMMUNITY): Admission: RE | Admit: 2021-08-14 | Payer: 59 | Source: Ambulatory Visit

## 2021-08-15 ENCOUNTER — Ambulatory Visit (HOSPITAL_COMMUNITY): Payer: 59

## 2021-08-15 ENCOUNTER — Telehealth: Payer: Self-pay | Admitting: Pulmonary Disease

## 2021-08-15 DIAGNOSIS — G4733 Obstructive sleep apnea (adult) (pediatric): Secondary | ICD-10-CM | POA: Diagnosis not present

## 2021-08-15 NOTE — Telephone Encounter (Signed)
Called and went over results with patient. She states she would rather focus on weight loss and avoid sleeping in supine position. Appt made for 3 month follow up. Nothing further needed.

## 2021-08-15 NOTE — Telephone Encounter (Signed)
HST showed mild OSA with AHI 11/ hr Events were worse when she was on her back  Options of treatment for mild obstructive sleep apnea include  -oral appliance or - CPAP therapy or  - No treatment with focus on weight loss and try to avoid sleeping on her back   FU OV in  2months to review

## 2021-08-15 NOTE — Telephone Encounter (Signed)
Called and went over results with patient again. Answered her questions and confirmed for her it was 11 times an hour. Nothing further needed.

## 2021-08-16 ENCOUNTER — Ambulatory Visit (HOSPITAL_COMMUNITY)
Admission: RE | Admit: 2021-08-16 | Discharge: 2021-08-16 | Disposition: A | Discharge status: Home / Self Care | Payer: 59 | Source: Ambulatory Visit | Attending: Pulmonary Disease | Admitting: Pulmonary Disease

## 2021-08-16 ENCOUNTER — Other Ambulatory Visit: Payer: Self-pay

## 2021-08-16 DIAGNOSIS — R0602 Shortness of breath: Secondary | ICD-10-CM | POA: Insufficient documentation

## 2021-08-16 LAB — PULMONARY FUNCTION TEST
DL/VA % pred: 147 %
DL/VA: 6.24 ml/min/mmHg/L
DLCO unc % pred: 120 %
DLCO unc: 27.02 ml/min/mmHg
FEF 25-75 Post: 2.7 L/sec
FEF 25-75 Pre: 3.24 L/sec
FEF2575-%Change-Post: -16 %
FEF2575-%Pred-Post: 95 %
FEF2575-%Pred-Pre: 114 %
FEV1-%Change-Post: -2 %
FEV1-%Pred-Post: 81 %
FEV1-%Pred-Pre: 84 %
FEV1-Post: 2.43 L
FEV1-Pre: 2.51 L
FEV1FVC-%Change-Post: -1 %
FEV1FVC-%Pred-Pre: 107 %
FEV6-%Change-Post: -1 %
FEV6-%Pred-Post: 78 %
FEV6-%Pred-Pre: 79 %
FEV6-Post: 2.87 L
FEV6-Pre: 2.92 L
FEV6FVC-%Change-Post: 0 %
FEV6FVC-%Pred-Post: 102 %
FEV6FVC-%Pred-Pre: 102 %
FVC-%Change-Post: -1 %
FVC-%Pred-Post: 76 %
FVC-%Pred-Pre: 77 %
FVC-Post: 2.87 L
FVC-Pre: 2.93 L
Post FEV1/FVC ratio: 85 %
Post FEV6/FVC ratio: 100 %
Pre FEV1/FVC ratio: 86 %
Pre FEV6/FVC Ratio: 100 %
RV % pred: 104 %
RV: 2 L
TLC % pred: 94 %
TLC: 5.06 L

## 2021-08-16 MED ORDER — ALBUTEROL SULFATE (2.5 MG/3ML) 0.083% IN NEBU
2.5000 mg | INHALATION_SOLUTION | Freq: Once | RESPIRATORY_TRACT | Status: AC
  Administered 2021-08-16: 2.5 mg via RESPIRATORY_TRACT

## 2021-10-19 ENCOUNTER — Telehealth: Payer: Self-pay | Admitting: Pulmonary Disease

## 2021-10-20 NOTE — Telephone Encounter (Signed)
Last OV notes state that patient needed a 3 month f/u to follow up on weight loss after HST results.  ? ?Dr. Elsworth Soho are you okay with this patient cancelling her appt until her situation with insurance is straightened out? ?

## 2021-10-23 NOTE — Telephone Encounter (Signed)
ATC patient. Left detailed message on cell phone vm (ok per dpr) notifying patient of response. Advised her to call back or message clinic on mychart and confirm that she wants to cancel appt.  ?

## 2021-11-10 NOTE — Telephone Encounter (Signed)
ATC patient when reviewing charts for Monday 11/10/21 to see if she still wanted to cancel appt. No answer and vm was full

## 2021-11-13 ENCOUNTER — Ambulatory Visit: Payer: 59 | Admitting: Pulmonary Disease

## 2021-11-21 IMAGING — DX DG CHEST 1V PORT
1 series · 1 of 1 positions shown · non-contrast
Comparison: April 14, 2020

CLINICAL DATA: Chest pain and tachycardia

EXAM:
PORTABLE CHEST 1 VIEW

[chest ap]
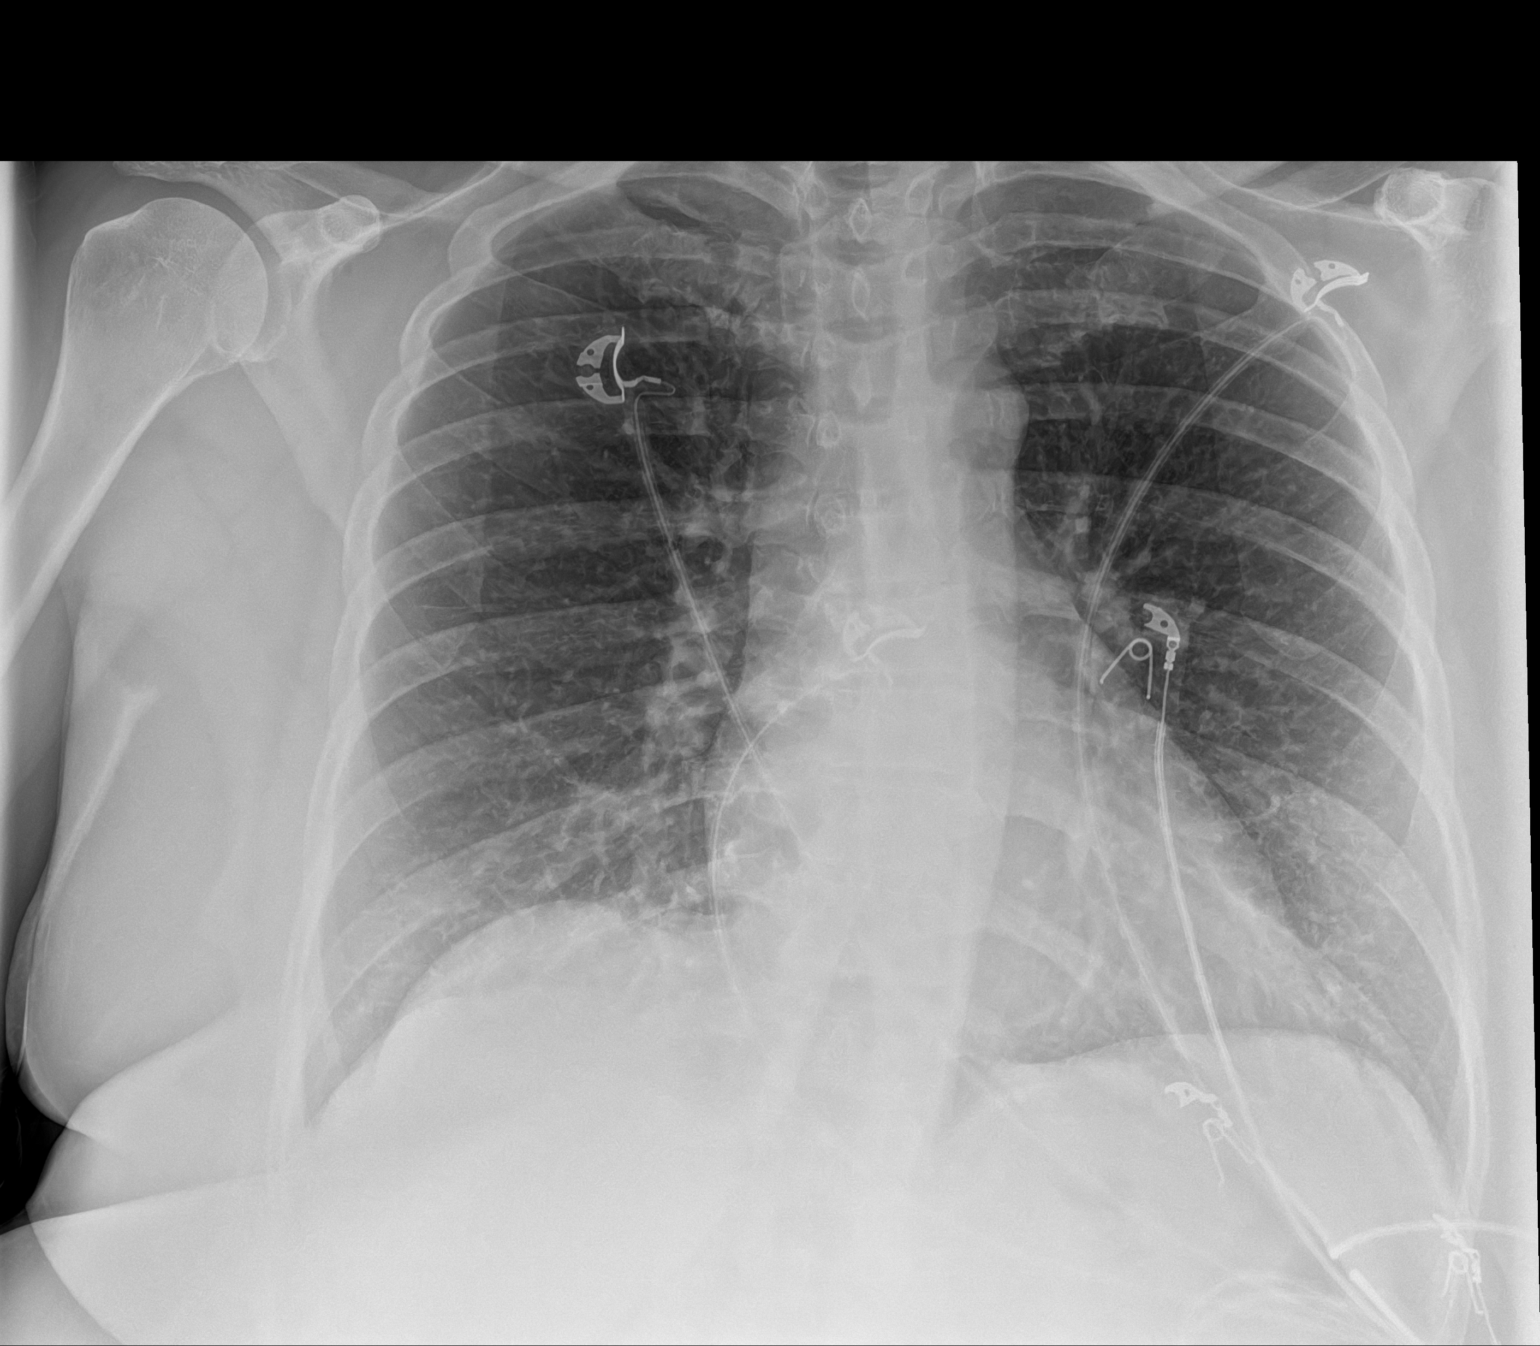

[1 of 1 positions shown; findings below may reference images not displayed]

FINDINGS: Lungs are clear. Heart size and pulmonary vascularity are normal. No
adenopathy. No pneumothorax. No bone lesions.
IMPRESSION: Lungs clear.  Cardiac silhouette within normal limits.

## 2022-09-25 ENCOUNTER — Other Ambulatory Visit: Payer: Self-pay

## 2022-09-25 ENCOUNTER — Encounter (HOSPITAL_COMMUNITY): Payer: Self-pay

## 2022-09-25 ENCOUNTER — Emergency Department (HOSPITAL_COMMUNITY)
Admission: EM | Admit: 2022-09-25 | Discharge: 2022-09-25 | Disposition: A | Discharge status: Home / Self Care | Payer: Self-pay | Attending: Emergency Medicine | Admitting: Emergency Medicine

## 2022-09-25 DIAGNOSIS — Z79899 Other long term (current) drug therapy: Secondary | ICD-10-CM | POA: Insufficient documentation

## 2022-09-25 DIAGNOSIS — I10 Essential (primary) hypertension: Secondary | ICD-10-CM | POA: Insufficient documentation

## 2022-09-25 LAB — HEPATIC FUNCTION PANEL
ALT: 47 U/L — ABNORMAL HIGH (ref 0–44)
AST: 32 U/L (ref 15–41)
Albumin: 4.2 g/dL (ref 3.5–5.0)
Alkaline Phosphatase: 53 U/L (ref 38–126)
Bilirubin, Direct: 0.1 mg/dL (ref 0.0–0.2)
Indirect Bilirubin: 0.5 mg/dL (ref 0.3–0.9)
Total Bilirubin: 0.6 mg/dL (ref 0.3–1.2)
Total Protein: 7.8 g/dL (ref 6.5–8.1)

## 2022-09-25 LAB — BASIC METABOLIC PANEL
Anion gap: 9 (ref 5–15)
BUN: 11 mg/dL (ref 6–20)
CO2: 23 mmol/L (ref 22–32)
Calcium: 9.1 mg/dL (ref 8.9–10.3)
Chloride: 104 mmol/L (ref 98–111)
Creatinine, Ser: 0.62 mg/dL (ref 0.44–1.00)
GFR, Estimated: >60 mL/min (ref >60)
Glucose, Bld: 136 mg/dL — ABNORMAL HIGH (ref 70–99)
Potassium: 3.7 mmol/L (ref 3.5–5.1)
Sodium: 136 mmol/L (ref 135–145)

## 2022-09-25 LAB — CBC
HCT: 43.3 % (ref 36.0–46.0)
Hemoglobin: 14.8 g/dL (ref 12.0–15.0)
MCH: 31.4 pg (ref 26.0–34.0)
MCHC: 34.2 g/dL (ref 30.0–36.0)
MCV: 91.9 fL (ref 80.0–100.0)
Platelets: 251 10*3/uL (ref 150–400)
RBC: 4.71 MIL/uL (ref 3.87–5.11)
RDW: 11.5 % (ref 11.5–15.5)
WBC: 5 10*3/uL (ref 4.0–10.5)
nRBC: 0 % (ref 0.0–0.2)

## 2022-09-25 LAB — TSH: TSH: 0.913 u[IU]/mL (ref 0.350–4.500)

## 2022-09-25 LAB — LIPASE, BLOOD: Lipase: 37 U/L (ref 11–51)

## 2022-09-25 MED ORDER — AMLODIPINE BESYLATE 10 MG PO TABS: 10.0000 mg | ORAL_TABLET | Freq: Every day | ORAL | 0 refills | Status: DC

## 2022-09-25 MED ORDER — HYDRALAZINE HCL 20 MG/ML IJ SOLN
5.0000 mg | Freq: Once | INTRAMUSCULAR | Status: AC
  Administered 2022-09-25: 5 mg via INTRAVENOUS
  Filled 2022-09-25: qty 1

## 2022-09-25 NOTE — ED Triage Notes (Signed)
Pt states over the past few days she has been having dizzy spells. Says the last few days her arms and fingers have been tingling when she woke up . Pt has arthritis so she has problems sleeping anyway but it's been worse recently. Pt states she thinks she is going through menopause and believes her estrogen is low? Has history of anxiety. Talked to [place] at [facility] this am and was recommended to come and be seen in the ED. Has taken lexapro and xanax in the past. Has a BP machine at home and readings this morning were: 144/101, 145/110

## 2022-09-25 NOTE — ED Provider Notes (Signed)
[facility] Provider Note   CSN: [id number] Arrival date & time: 09/25/22  [id number]     History {Add pertinent medical, surgical, social history, OB history to HPI:1} Chief Complaint  Patient presents with   Hypertension    Kristin Sanders is a 52 y.o. female with a history including depression and anxiety, she is currently perimenopausal but still having periods, has been under increased stress recently but states has been having episodic dizzy spells which she describes as transient dizziness when she stands quickly which resolves after a minute.  She has also had some tingling in her arms and fingertips upon first waking which quickly resolves.  She has been taking her blood pressure at home since the symptoms began and has been getting numbers in the range of 144/101.  She denies headache, chest pain, shortness of breath, peripheral edema, also no abdominal pain, nausea or vomiting although endorses some brief nausea this morning after her IV was started here which has since resolved.  She has noted some episodes of blurred vision, not currently.  She has had increased alcohol intake this past week secondary to the recent holiday.  11:03 AM Pt provides additional information,  has been taking phentermine 37.5 mg daily for weight loss, but has not taken in the past 5 days.   The history is provided by the patient.       Home Medications Prior to Admission medications   Medication Sig Start Date End Date Taking? Authorizing Provider  ALPRAZolam Duanne Moron) 0.25 MG tablet Take 0.25 mg by mouth at bedtime as needed for anxiety. Patient not taking: Reported on 05/30/2021    [provider]  Biotin 10000 MCG TABS Take by mouth. Patient not taking: Reported on 05/30/2021    [provider]  Multiple Vitamin (MULTIVITAMIN) capsule Take 1 capsule by mouth daily. Patient not taking: Reported on 05/30/2021    [provider]   Turmeric-Ginger 150-25 MG CHEW Chew by mouth.    [provider]      Allergies    Codeine    Review of Systems   Review of Systems  Constitutional:  Negative for chills and fever.  HENT:  Negative for congestion and sore throat.   Eyes: Negative.   Respiratory:  Negative for chest tightness and shortness of breath.   Cardiovascular:  Negative for chest pain, palpitations and leg swelling.  Gastrointestinal:  Positive for nausea. Negative for abdominal pain and vomiting.  Genitourinary: Negative.   Musculoskeletal:  Negative for arthralgias, joint swelling and neck pain.  Skin: Negative.  Negative for rash and wound.  Neurological:  Positive for light-headedness. Negative for dizziness, weakness, numbness and headaches.  Psychiatric/Behavioral: Negative.      Physical Exam Updated Vital Signs BP (!) 171/110   Pulse 83   Temp 98.4 F (36.9 C) (Oral)   Resp (!) 21   Ht 5\' 6"  (1.676 m)   Wt 86.2 kg   LMP 09/14/2022   SpO2 98%   BMI 30.67 kg/m  Physical Exam Vitals and nursing note reviewed.  Constitutional:      Appearance: She is well-developed.  HENT:     Head: Normocephalic and atraumatic.  Eyes:     Conjunctiva/sclera: Conjunctivae normal.  Cardiovascular:     Rate and Rhythm: Normal rate and regular rhythm.     Heart sounds: Normal heart sounds.  Pulmonary:     Effort: Pulmonary effort is normal.     Breath sounds:  Normal breath sounds. No wheezing or rales.  Abdominal:     General: Bowel sounds are normal.     Palpations: Abdomen is soft.     Tenderness: There is no abdominal tenderness.  Musculoskeletal:        General: Normal range of motion.     Cervical back: Normal range of motion.     Right lower leg: No edema.     Left lower leg: No edema.  Skin:    General: Skin is warm and dry.  Neurological:     Mental Status: She is alert.     ED Results / Procedures / Treatments   Labs (all labs ordered are listed, but only abnormal results  are displayed) Labs Reviewed  BASIC METABOLIC PANEL - Abnormal; Notable for the following components:      Result Value   Glucose, Bld 136 (*)    All other components within normal limits  CBC  TSH    EKG None  Radiology No results found.  Procedures Procedures  {Document cardiac monitor, telemetry assessment procedure when appropriate:1}  Medications Ordered in ED Medications  hydrALAZINE (APRESOLINE) injection 5 mg (has no administration in time range)    ED Course/ Medical Decision Making/ A&P   {   Click here for ABCD2, HEART and other calculatorsREFRESH Note before signing :1}                          Medical Decision Making Amount and/or Complexity of Data Reviewed Labs: ordered.  Risk Prescription drug management.    {Document critical care time when appropriate:1} {Document review of labs and clinical decision tools ie heart score, Chads2Vasc2 etc:1}  {Document your independent review of radiology images, and any outside records:1} {Document your discussion with family members, caretakers, and with consultants:1} {Document social determinants of health affecting pt's care:1} {Document your decision making why or why not admission, treatments were needed:1} Final Clinical Impression(s) / ED Diagnoses Final diagnoses:  None    Rx / DC Orders ED Discharge Orders     None

## 2022-09-25 NOTE — ED Notes (Signed)
Dark green, light green, lav, blue, and gold x2 taken to Lab.

## 2022-09-25 NOTE — Discharge Instructions (Signed)
Your lab tests today are all reassuring with no sign of endorgan damage as we discussed regarding blood pressure.  It is possible your elevation is secondary to the supplements you have been taking, however since it has been 5 days since your last dose your symptoms should be improving rather than worsening, therefore I am prescribing you a blood pressure medication for you to take daily.  However I do want you to continue to check your blood pressure once daily, if your blood pressures start become lower than your norm, I would suggest holding this blood pressure medication until you follow-up with your primary doctor.

## 2022-10-24 IMAGING — US US PELVIS COMPLETE WITH TRANSVAGINAL
1 series · 13 of 25 positions shown · non-contrast
Comparison: None

CLINICAL DATA: Irregular bleeding, bloating, LMP 05/25/2021,
history of tubal ligation

EXAM:
TRANSABDOMINAL AND TRANSVAGINAL ULTRASOUND OF PELVIS
TECHNIQUE: Both transabdominal and transvaginal ultrasound examinations of the
pelvis were performed. Transabdominal technique was performed for
global imaging of the pelvis including uterus, ovaries, adnexal
regions, and pelvic cul-de-sac. It was necessary to proceed with
endovaginal exam following the transabdominal exam to visualize the
endometrium and ovaries.

[Series 1: us pelvic complete with transvaginal · 13 of 97 slices shown]
[im 1/97]
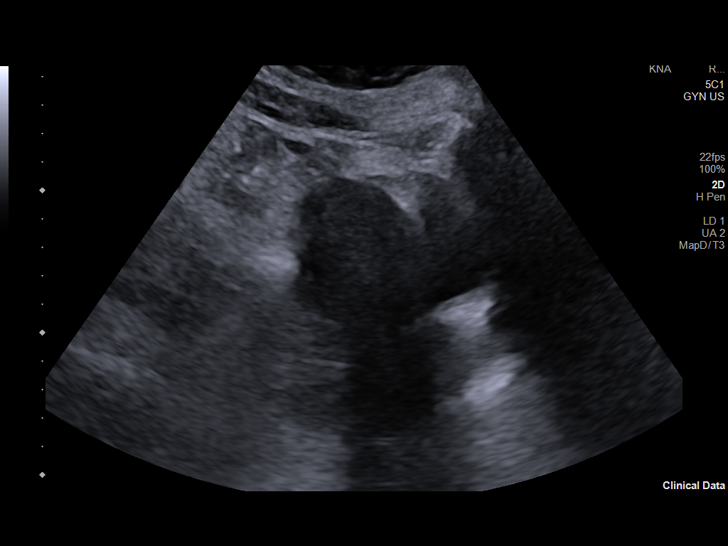
[im 9/97]
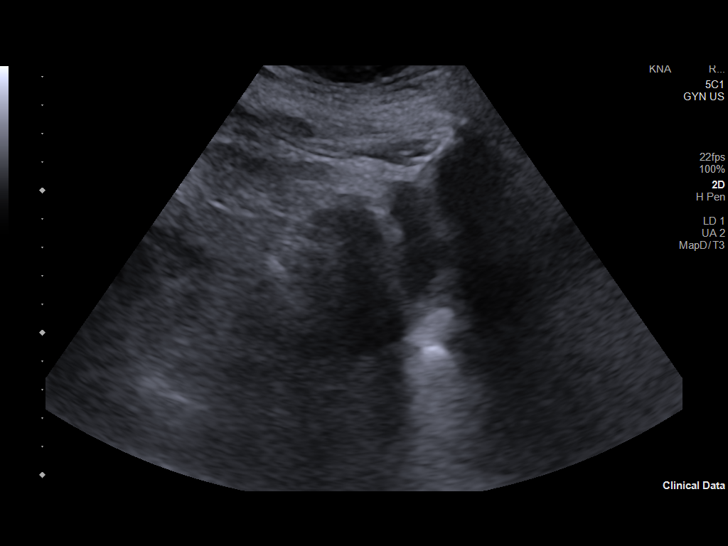
[im 17/97]
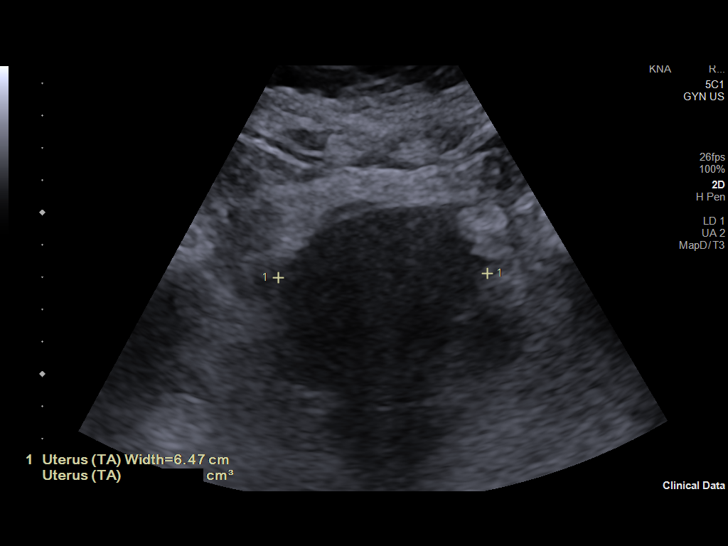
[im 25/97]
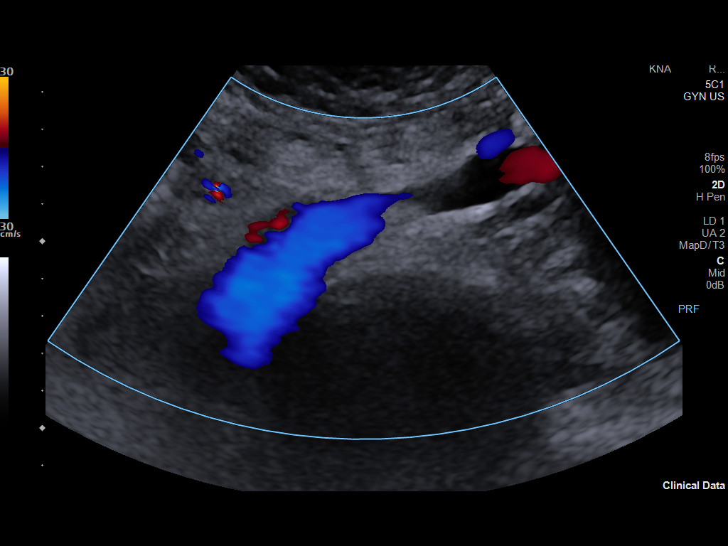
[im 33/97]
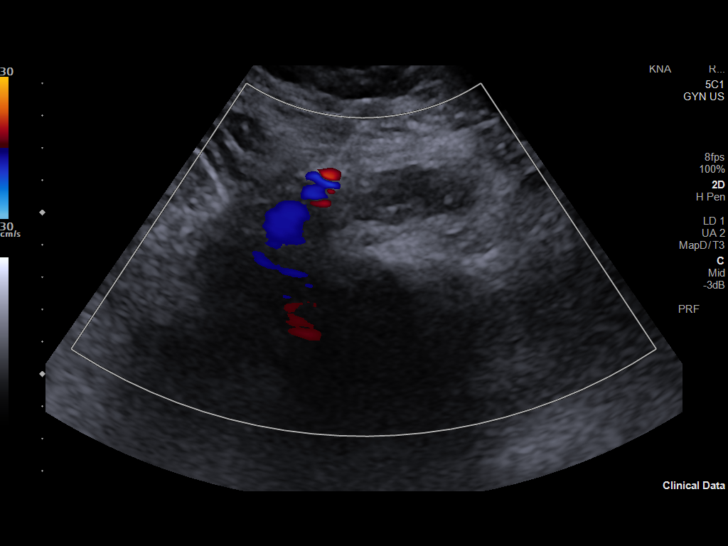
[im 41/97]
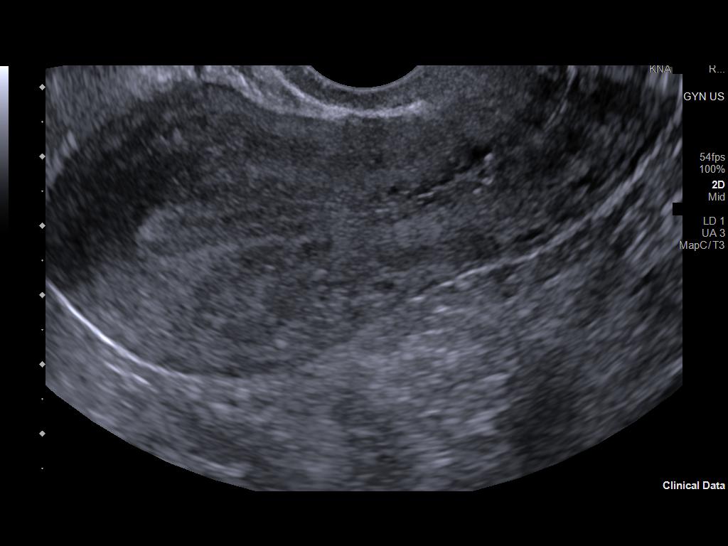
[im 49/97]
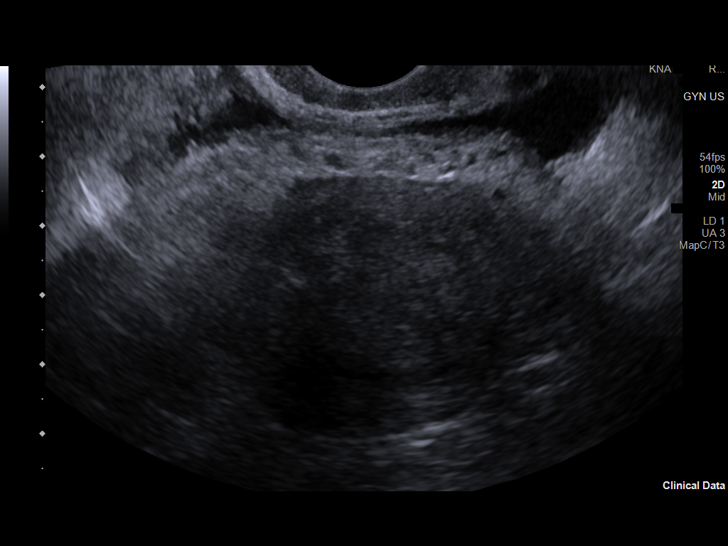
[im 57/97]
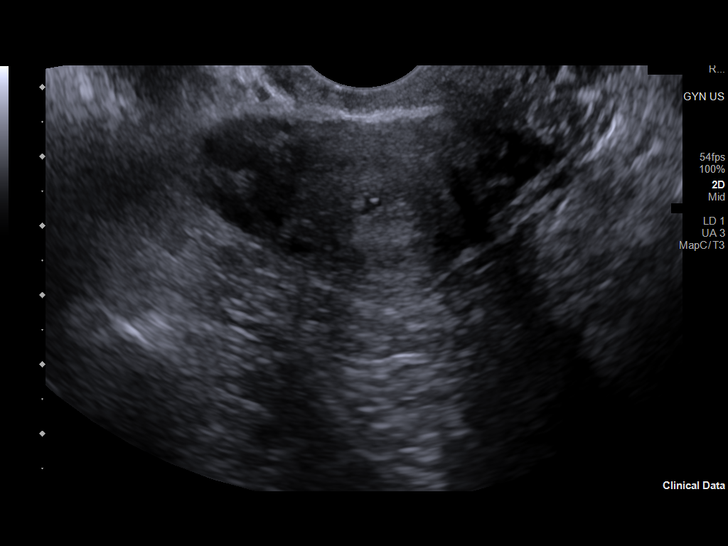
[im 65/97]
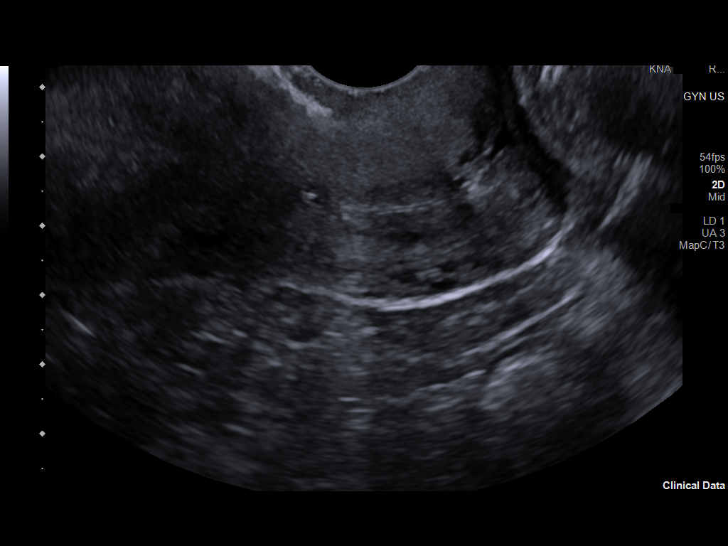
[im 73/97]
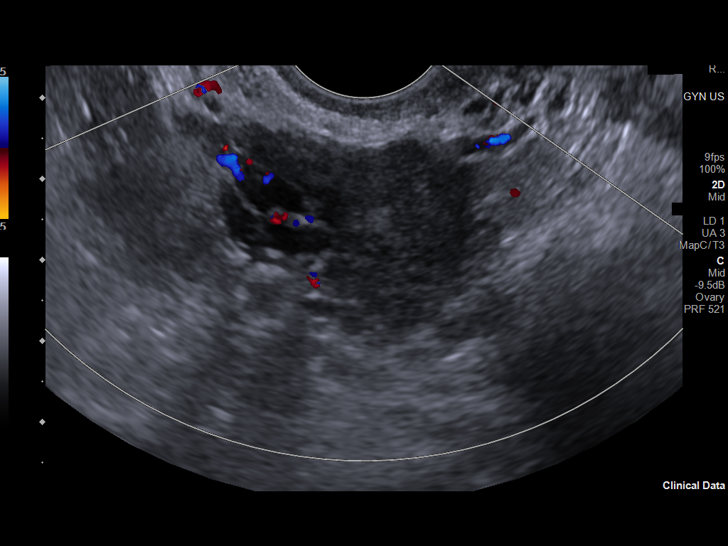
[im 81/97]
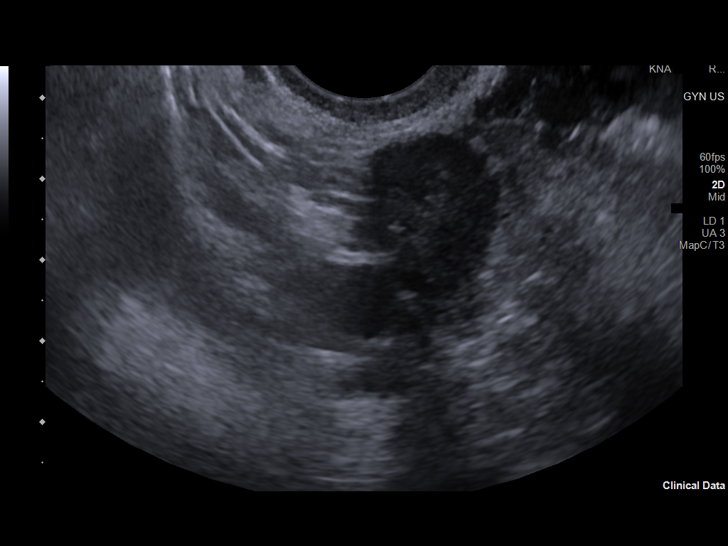
[im 89/97]
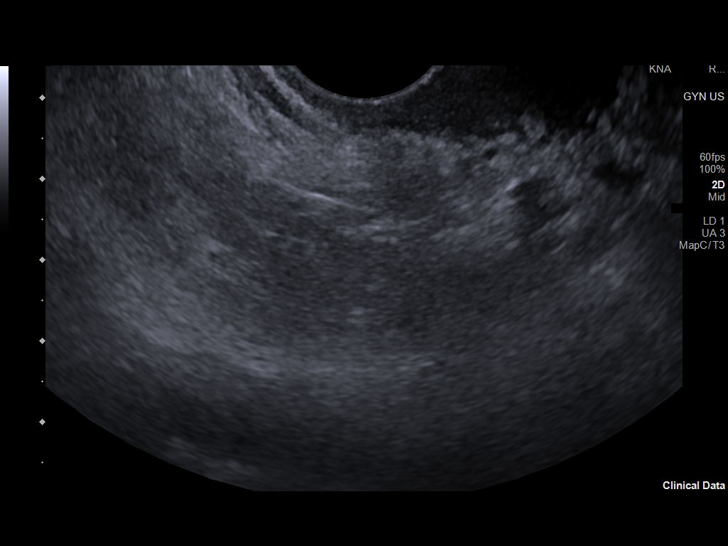
[im 97/97]
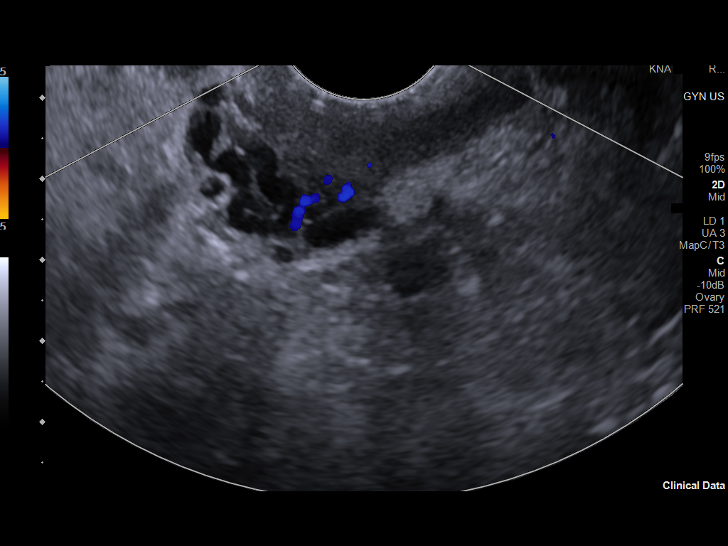

[13 of 25 positions shown; findings below may reference images not displayed]

FINDINGS: Uterus

Measurements: 9.5 x 4.5 x 6.5 cm = volume: 146 mL. Anteverted.
Normal morphology without mass. Multiple nabothian cysts in cervix,
several of which contain scattered internal echogenicity.

Endometrium

Thickness: 8 mm.  No endometrial fluid or mass

Right ovary

Measurements: 3.3 x 2.2 x 1.8 cm = volume: 6.8 mL. Normal morphology
without mass

Left ovary

Measurements: 3.4 x 1.8 x 1.7 cm = volume: 5.6 mL. Normal morphology
without mass

Other findings

No free pelvic fluid. No adnexal masses. Minimal fluid within
vagina.
IMPRESSION: Normal appearing uterus, endometrial complex and ovaries.

Minimal fluid within vagina, nonspecific.

## 2022-11-08 ENCOUNTER — Ambulatory Visit: Payer: Self-pay | Admitting: Adult Health

## 2023-08-26 ENCOUNTER — Other Ambulatory Visit (HOSPITAL_COMMUNITY): Payer: Self-pay | Admitting: Family Medicine

## 2023-08-26 DIAGNOSIS — E041 Nontoxic single thyroid nodule: Secondary | ICD-10-CM

## 2023-09-02 ENCOUNTER — Emergency Department (HOSPITAL_COMMUNITY): Payer: Self-pay

## 2023-09-02 ENCOUNTER — Emergency Department (HOSPITAL_COMMUNITY)
Admission: EM | Admit: 2023-09-02 | Discharge: 2023-09-02 | Disposition: A | Discharge status: Home / Self Care | Payer: Self-pay | Attending: Emergency Medicine | Admitting: Emergency Medicine

## 2023-09-02 DIAGNOSIS — R202 Paresthesia of skin: Secondary | ICD-10-CM | POA: Insufficient documentation

## 2023-09-02 DIAGNOSIS — Z79899 Other long term (current) drug therapy: Secondary | ICD-10-CM | POA: Insufficient documentation

## 2023-09-02 DIAGNOSIS — I10 Essential (primary) hypertension: Secondary | ICD-10-CM | POA: Insufficient documentation

## 2023-09-02 DIAGNOSIS — M549 Dorsalgia, unspecified: Secondary | ICD-10-CM | POA: Diagnosis not present

## 2023-09-02 DIAGNOSIS — R200 Anesthesia of skin: Secondary | ICD-10-CM | POA: Diagnosis present

## 2023-09-02 LAB — CBC WITH DIFFERENTIAL/PLATELET
Abs Immature Granulocytes: 0.02 10*3/uL (ref 0.00–0.07)
Basophils Absolute: 0.1 10*3/uL (ref 0.0–0.1)
Basophils Relative: 1 %
Eosinophils Absolute: 0.1 10*3/uL (ref 0.0–0.5)
Eosinophils Relative: 2 %
HCT: 45.3 % (ref 36.0–46.0)
Hemoglobin: 15.1 g/dL — ABNORMAL HIGH (ref 12.0–15.0)
Immature Granulocytes: 0 %
Lymphocytes Relative: 26 %
Lymphs Abs: 1.7 10*3/uL (ref 0.7–4.0)
MCH: 30.1 pg (ref 26.0–34.0)
MCHC: 33.3 g/dL (ref 30.0–36.0)
MCV: 90.4 fL (ref 80.0–100.0)
Monocytes Absolute: 0.5 10*3/uL (ref 0.1–1.0)
Monocytes Relative: 7 %
Neutro Abs: 4.3 10*3/uL (ref 1.7–7.7)
Neutrophils Relative %: 64 %
Platelets: 269 10*3/uL (ref 150–400)
RBC: 5.01 MIL/uL (ref 3.87–5.11)
RDW: 11 % — ABNORMAL LOW (ref 11.5–15.5)
WBC: 6.7 10*3/uL (ref 4.0–10.5)
nRBC: 0 % (ref 0.0–0.2)

## 2023-09-02 LAB — BASIC METABOLIC PANEL
Anion gap: 10 (ref 5–15)
BUN: 8 mg/dL (ref 6–20)
CO2: 23 mmol/L (ref 22–32)
Calcium: 9.7 mg/dL (ref 8.9–10.3)
Chloride: 107 mmol/L (ref 98–111)
Creatinine, Ser: 0.49 mg/dL (ref 0.44–1.00)
GFR, Estimated: >60 mL/min (ref >60)
Glucose, Bld: 119 mg/dL — ABNORMAL HIGH (ref 70–99)
Potassium: 3.7 mmol/L (ref 3.5–5.1)
Sodium: 140 mmol/L (ref 135–145)

## 2023-09-02 LAB — TROPONIN I (HIGH SENSITIVITY): Troponin I (High Sensitivity): 4 ng/L (ref <18)

## 2023-09-02 NOTE — ED Provider Notes (Signed)
 [place] EMERGENCY DEPARTMENT AT [facility] Provider Note   CSN: [id number] Arrival date & time: 09/02/23  2440     History {Add pertinent medical, surgical, social history, OB history to HPI:1} Chief Complaint  Patient presents with   Back Pain   Numbness    Kristin Sanders is a 53 y.o. female.  Patient complains of numbness in all her extremities and some back pain.  This has resolved.  Patient has a history of hypertension and has recently been seen by her family doctor and they have placed her on Lexapro and Xanax as needed.   Back Pain      Home Medications Prior to Admission medications   Medication Sig Start Date End Date Taking? Authorizing Provider  ALPRAZolam Prudy Feeler) 0.25 MG tablet Take 0.25 mg by mouth at bedtime as needed for anxiety. Patient not taking: Reported on 05/30/2021    [provider]  amLODipine (NORVASC) 10 MG tablet Take 1 tablet (10 mg total) by mouth daily. 09/25/22   Burgess Amor, PA-C  Biotin 10272 MCG TABS Take by mouth. Patient not taking: Reported on 05/30/2021    [provider]  Multiple Vitamin (MULTIVITAMIN) capsule Take 1 capsule by mouth daily. Patient not taking: Reported on 05/30/2021    [provider]  Turmeric-Ginger 150-25 MG CHEW Chew by mouth.    [provider]      Allergies    Codeine    Review of Systems   Review of Systems  Musculoskeletal:  Positive for back pain.    Physical Exam Updated Vital Signs BP (!) 143/102 (BP Location: Left Arm)   Pulse 76   Temp (!) 97.4 F (36.3 C) (Oral)   Resp 18   SpO2 99%  Physical Exam  ED Results / Procedures / Treatments   Labs (all labs ordered are listed, but only abnormal results are displayed) Labs Reviewed  CBC WITH DIFFERENTIAL/PLATELET - Abnormal; Notable for the following components:      Result Value   Hemoglobin 15.1 (*)    RDW 11.0 (*)    All other components within normal limits  BASIC METABOLIC PANEL -  Abnormal; Notable for the following components:   Glucose, Bld 119 (*)    All other components within normal limits  TROPONIN I (HIGH SENSITIVITY)    EKG EKG Interpretation Date/Time:  Monday September 02 2023 11:07:47 EDT Ventricular Rate:  69 PR Interval:  163 QRS Duration:  85 QT Interval:  396 QTC Calculation: 425 R Axis:   21  Text Interpretation: Sinus rhythm Low voltage, precordial leads Confirmed by Bethann Berkshire [phone]) on 09/02/2023 12:24:46 PM  Radiology DG Chest 2 View Result Date: 09/02/2023 CLINICAL DATA:  Shortness of breath. EXAM: CHEST - 2 VIEW COMPARISON:  07/04/2020 FINDINGS: Heart size and mediastinal contours appear normal. There is no pleural fluid, interstitial edema or airspace disease. The visualized osseous structures are unremarkable. IMPRESSION: No active cardiopulmonary disease. Electronically Signed   By: Signa Kell M.D.   On: 09/02/2023 10:55    Procedures Procedures  {Document cardiac monitor, telemetry assessment procedure when appropriate:1}  Medications Ordered in ED Medications - No data to display  ED Course/ Medical Decision Making/ A&P   {   Click here for ABCD2, HEART and other calculatorsREFRESH Note before signing :1}                              Medical Decision Making  Amount and/or Complexity of Data Reviewed Labs: ordered. Radiology: ordered. ECG/medicine tests: ordered.   Paresthesias that have resolved.  Patient most likely has anxiety and stress related symptoms.  She will continue her Lexapro and Xanax and follow back up with her PCP  {Document critical care time when appropriate:1} {Document review of labs and clinical decision tools ie heart score, Chads2Vasc2 etc:1}  {Document your independent review of radiology images, and any outside records:1} {Document your discussion with family members, caretakers, and with consultants:1} {Document social determinants of health affecting pt's care:1} {Document your decision  making why or why not admission, treatments were needed:1} Final Clinical Impression(s) / ED Diagnoses Final diagnoses:  Paresthesia    Rx / DC Orders ED Discharge Orders     None

## 2023-09-02 NOTE — ED Triage Notes (Signed)
 Pt with multiple complaints during triage. She states that when she woke up this morning she was having thoracic back pain and neck pain and felt "tingling" in her extremities. Pt states that she called EMS who checked her out at that time and declined transport to the hospital. She was recently diagnosed with anxiety and started on Xanax and Lexapro and so she took a Xanax after EMS left and tried going back to sleep. She was unable to do so because of the numbness. No unilateral weakness or different sensation noted during triage. Pt also c/o abdominal pain. She states that she was recently seen for similar issue in [place] and they did an [place] which showed a L thyroid nodule, but has yet to have it biopsied.

## 2023-09-02 NOTE — Discharge Instructions (Addendum)
 Continue present medications that your doctor has prescribed.  Follow-up with your family doctor in the next week or so

## 2023-09-11 ENCOUNTER — Other Ambulatory Visit: Payer: Self-pay | Admitting: Family Medicine

## 2023-09-11 DIAGNOSIS — E041 Nontoxic single thyroid nodule: Secondary | ICD-10-CM

## 2023-09-13 ENCOUNTER — Emergency Department (HOSPITAL_COMMUNITY)

## 2023-09-13 ENCOUNTER — Emergency Department (HOSPITAL_COMMUNITY)
Admission: EM | Admit: 2023-09-13 | Discharge: 2023-09-13 | Disposition: A | Discharge status: Home / Self Care | Attending: Emergency Medicine | Admitting: Emergency Medicine

## 2023-09-13 ENCOUNTER — Other Ambulatory Visit: Payer: Self-pay

## 2023-09-13 ENCOUNTER — Encounter (HOSPITAL_COMMUNITY): Payer: Self-pay | Admitting: Emergency Medicine

## 2023-09-13 DIAGNOSIS — R1030 Lower abdominal pain, unspecified: Secondary | ICD-10-CM | POA: Diagnosis present

## 2023-09-13 DIAGNOSIS — F419 Anxiety disorder, unspecified: Secondary | ICD-10-CM | POA: Insufficient documentation

## 2023-09-13 DIAGNOSIS — F32A Depression, unspecified: Secondary | ICD-10-CM | POA: Diagnosis not present

## 2023-09-13 DIAGNOSIS — R091 Pleurisy: Secondary | ICD-10-CM | POA: Diagnosis not present

## 2023-09-13 DIAGNOSIS — R197 Diarrhea, unspecified: Secondary | ICD-10-CM | POA: Insufficient documentation

## 2023-09-13 DIAGNOSIS — R202 Paresthesia of skin: Secondary | ICD-10-CM | POA: Insufficient documentation

## 2023-09-13 DIAGNOSIS — R1012 Left upper quadrant pain: Secondary | ICD-10-CM | POA: Diagnosis not present

## 2023-09-13 DIAGNOSIS — R109 Unspecified abdominal pain: Secondary | ICD-10-CM

## 2023-09-13 DIAGNOSIS — M542 Cervicalgia: Secondary | ICD-10-CM | POA: Diagnosis not present

## 2023-09-13 LAB — CBC WITH DIFFERENTIAL/PLATELET
Abs Immature Granulocytes: 0.01 10*3/uL (ref 0.00–0.07)
Basophils Absolute: 0.1 10*3/uL (ref 0.0–0.1)
Basophils Relative: 1 %
Eosinophils Absolute: 0.1 10*3/uL (ref 0.0–0.5)
Eosinophils Relative: 2 %
HCT: 43.1 % (ref 36.0–46.0)
Hemoglobin: 14.9 g/dL (ref 12.0–15.0)
Immature Granulocytes: 0 %
Lymphocytes Relative: 26 %
Lymphs Abs: 1.4 10*3/uL (ref 0.7–4.0)
MCH: 31 pg (ref 26.0–34.0)
MCHC: 34.6 g/dL (ref 30.0–36.0)
MCV: 89.6 fL (ref 80.0–100.0)
Monocytes Absolute: 0.5 10*3/uL (ref 0.1–1.0)
Monocytes Relative: 9 %
Neutro Abs: 3.5 10*3/uL (ref 1.7–7.7)
Neutrophils Relative %: 62 %
Platelets: 249 10*3/uL (ref 150–400)
RBC: 4.81 MIL/uL (ref 3.87–5.11)
RDW: 11.7 % (ref 11.5–15.5)
WBC: 5.6 10*3/uL (ref 4.0–10.5)
nRBC: 0 % (ref 0.0–0.2)

## 2023-09-13 LAB — URINALYSIS, ROUTINE W REFLEX MICROSCOPIC
Bilirubin Urine: NEGATIVE
Glucose, UA: NEGATIVE mg/dL
Ketones, ur: NEGATIVE mg/dL
Leukocytes,Ua: NEGATIVE
Nitrite: NEGATIVE
Protein, ur: NEGATIVE mg/dL
Specific Gravity, Urine: 1.012 (ref 1.005–1.030)
pH: 7 (ref 5.0–8.0)

## 2023-09-13 LAB — COMPREHENSIVE METABOLIC PANEL
ALT: 30 U/L (ref 0–44)
AST: 20 U/L (ref 15–41)
Albumin: 4.4 g/dL (ref 3.5–5.0)
Alkaline Phosphatase: 40 U/L (ref 38–126)
Anion gap: 11 (ref 5–15)
BUN: 9 mg/dL (ref 6–20)
CO2: 22 mmol/L (ref 22–32)
Calcium: 9.5 mg/dL (ref 8.9–10.3)
Chloride: 103 mmol/L (ref 98–111)
Creatinine, Ser: 0.61 mg/dL (ref 0.44–1.00)
GFR, Estimated: >60 mL/min (ref >60)
Glucose, Bld: 111 mg/dL — ABNORMAL HIGH (ref 70–99)
Potassium: 3.7 mmol/L (ref 3.5–5.1)
Sodium: 136 mmol/L (ref 135–145)
Total Bilirubin: 1 mg/dL (ref 0.0–1.2)
Total Protein: 7.7 g/dL (ref 6.5–8.1)

## 2023-09-13 LAB — D-DIMER, QUANTITATIVE: D-Dimer, Quant: 0.3 ug{FEU}/mL (ref 0.00–0.50)

## 2023-09-13 LAB — LIPASE, BLOOD: Lipase: 37 U/L (ref 11–51)

## 2023-09-13 LAB — TROPONIN I (HIGH SENSITIVITY): Troponin I (High Sensitivity): <2 ng/L (ref <18)

## 2023-09-13 MED ORDER — KETOROLAC TROMETHAMINE 15 MG/ML IJ SOLN
15.0000 mg | Freq: Once | INTRAMUSCULAR | Status: AC
  Administered 2023-09-13: 15 mg via INTRAVENOUS
  Filled 2023-09-13: qty 1

## 2023-09-13 MED ORDER — LIDOCAINE 5 % EX PTCH
1.0000 | MEDICATED_PATCH | CUTANEOUS | 0 refills | Status: DC
Start: 2023-09-13 — End: 2023-11-12

## 2023-09-13 MED ORDER — IOHEXOL 300 MG/ML  SOLN
100.0000 mL | Freq: Once | INTRAMUSCULAR | Status: AC | PRN
  Administered 2023-09-13: 100 mL via INTRAVENOUS

## 2023-09-13 NOTE — ED Triage Notes (Signed)
 Pt bib pov w/ multiple complaints. Pt is rambling in triage and reports she is very nervous. She reports a history of anxiety and depression that she takes lexapro and xanax for. Pt mentions numbness in her extremities with shooting pains down her legs that make it difficult to walk. Pt reports the veins in her arm get very large and bulge out of her arm. She has been having abdominal pain for a while and was referred out to obgyn. That appt has not yet been made. Pt reports significant sharp abdominal pain. Pt is very jittery in triage and anxious in triage with many complaints and worries about her health.  Pt also reports some difficulty passing Bm but had a normal one this morning. Pt is tearful in triage and "just wants to know what's wrong."

## 2023-09-13 NOTE — ED Provider Notes (Addendum)
 [place] [facility]ERGENCY DEPARTMENT AT [facility] Provider Note   CSN: [id number] Arrival date & time: 09/13/23  8295     History  Chief Complaint  Patient presents with   Abdominal Pain    XANDRA LARAMEE is a 53 y.o. female.  She has PMH of depression and anxiety and reports she has going through menopause, recently had periods lasting about 2 days which is her usual.  Presents today complaining of blood complaints but primarily today was brought in by pain under the left breast and in the left back that is described as sharp.  It started early this morning.  She also had some sharp suprapubic pain when she rolled over but that is now resolved.  She states her pain in the chest and back was worse with breathing and felt like something with "catching".  Denies injury or trauma or heavy lifting, denies shortness of breath, no leg pain or swelling.  No fever chills or cough or other respiratory complaints.  Ports additionally that she has a lot of medical things going on that she is having worked up.  She states she wakes up every night with pain in her neck and numbness in both her arms.  She had this worked up in [place] when she was visiting her daughter and was told that she had a pinched nerve in her neck and is further undergoing workup.  She is also going evaluation for some incidentally found thyroid nodules.  Reports she started Lexapro and as needed Xanax for her negative thoughts a couple of weeks ago and this has been helpful, denies SI or HI.  Wrist that when she woke up this morning she felt like she has some bulging veins in her arms that have since resolved.  Denies injury or trauma or pain in her arms at this time.  Denies any current numbness tingling or weakness.  No saddle anesthesia or paresthesia, no bowel or bladder incontinence, no UTI symptoms.  Patient also notes that she been having diarrhea and overall abnormal bowel movements for the past couple of months,  she has upcoming colonoscopy.  Abdominal Pain      Home Medications Prior to Admission medications   Medication Sig Start Date End Date Taking? Authorizing Provider  lidocaine (LIDODERM) 5 % Place 1 patch onto the skin daily. Remove & Discard patch within 12 hours or as directed by MD 09/13/23  Yes Cristi Loron, Johnae Friley A, PA-C  ALPRAZolam (XANAX) 0.25 MG tablet Take 0.25 mg by mouth at bedtime as needed for anxiety. Patient not taking: Reported on 05/30/2021    [provider]  amLODipine (NORVASC) 10 MG tablet Take 1 tablet (10 mg total) by mouth daily. 09/25/22   Burgess Amor, PA-C  Biotin 62130 MCG TABS Take by mouth. Patient not taking: Reported on 05/30/2021    [provider]  Multiple Vitamin (MULTIVITAMIN) capsule Take 1 capsule by mouth daily. Patient not taking: Reported on 05/30/2021    [provider]  Turmeric-Ginger 150-25 MG CHEW Chew by mouth.    [provider]      Allergies    Codeine    Review of Systems   Review of Systems  Gastrointestinal:  Positive for abdominal pain.    Physical Exam Updated Vital Signs BP (!) 123/97   Pulse 70   Temp 97.7 F (36.5 C) (Oral)   Resp 18   Ht 5\' 6"  (1.676 m)   Wt 90.7 kg   LMP 08/19/2023 (Exact  Date)   SpO2 96%   BMI 32.28 kg/m  Physical Exam Vitals and nursing note reviewed.  Constitutional:      General: She is not in acute distress.    Appearance: She is well-developed.  HENT:     Head: Normocephalic and atraumatic.  Eyes:     Conjunctiva/sclera: Conjunctivae normal.  Cardiovascular:     Rate and Rhythm: Normal rate and regular rhythm.     Heart sounds: No murmur heard. Pulmonary:     Effort: Pulmonary effort is normal. No respiratory distress.     Breath sounds: Normal breath sounds.  Abdominal:     General: Abdomen is flat.     Palpations: Abdomen is soft.     Tenderness: There is abdominal tenderness in the suprapubic area and left upper quadrant. There is no guarding or  rebound. Negative signs include Murphy's sign and McBurney's sign.  Musculoskeletal:        General: No swelling.     Cervical back: Neck supple.  Skin:    General: Skin is warm and dry.     Capillary Refill: Capillary refill takes less than 2 seconds.     Comments: No skin changes over chest or abdomen, specifically no linear dermatomal rashes.  No hyperesthesia  Neurological:     Mental Status: She is alert.  Psychiatric:        Mood and Affect: Mood normal.     ED Results / Procedures / Treatments   Labs (all labs ordered are listed, but only abnormal results are displayed) Labs Reviewed  COMPREHENSIVE METABOLIC PANEL - Abnormal; Notable for the following components:      Result Value   Glucose, Bld 111 (*)    All other components within normal limits  URINALYSIS, ROUTINE W REFLEX MICROSCOPIC - Abnormal; Notable for the following components:   Hgb urine dipstick SMALL (*)    Bacteria, UA RARE (*)    All other components within normal limits  CBC WITH DIFFERENTIAL/PLATELET  LIPASE, BLOOD  D-DIMER, QUANTITATIVE  TROPONIN I (HIGH SENSITIVITY)    EKG EKG Interpretation Date/Time:  Friday September 13 2023 09:56:48 EDT Ventricular Rate:  67 PR Interval:  156 QRS Duration:  102 QT Interval:  394 QTC Calculation: 416 R Axis:   52  Text Interpretation: Sinus rhythm Probable left atrial enlargement No significant change since prior 3/25 Confirmed by Meridee Score [phone]) on 09/13/2023 9:59:16 AM  Radiology CT ABDOMEN PELVIS W CONTRAST Result Date: 09/13/2023 CLINICAL DATA:  Abdominal pain, acute, nonlocalized LUQ pain and suprapubic pain. EXAM: CT ABDOMEN AND PELVIS WITH CONTRAST TECHNIQUE: Multidetector CT imaging of the abdomen and pelvis was performed using the standard protocol following bolus administration of intravenous contrast. RADIATION DOSE REDUCTION: This exam was performed according to the departmental dose-optimization program which includes automated exposure  control, adjustment of the mA and/or kV according to patient size and/or use of iterative reconstruction technique. CONTRAST:  OMNIPAQUE IOHEXOL 300 MG/ML  SOLN COMPARISON:  None Available. FINDINGS: Lower chest: The lung bases are clear. No pleural effusion. The heart is normal in size. No pericardial effusion. 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 Hepatobiliary: The liver is normal in size. Non-cirrhotic configuration. No suspicious mass. No intrahepatic or extrahepatic bile duct dilation. The gallbladder is physiologically distended. There is a single calcified 1.9 x 2.2 cm gallstone without imaging signs of acute cholecystitis. No abnormal wall thickening or pericholecystic fat stranding. Pancreas: Unremarkable. No pancreatic ductal dilatation or surrounding inflammatory changes.  Spleen: Within normal limits. No focal lesion. Adrenals/Urinary Tract: Adrenal glands are unremarkable. No suspicious renal mass. No hydronephrosis. No renal or ureteric calculi. Unremarkable urinary bladder. Stomach/Bowel: No disproportionate dilation of the small or large bowel loops. No evidence of abnormal bowel wall thickening or inflammatory changes. The appendix is unremarkable. There are multiple diverticula throughout the colon, without imaging signs of diverticulitis. Vascular/Lymphatic: There is trace amount of ascites in the dependent pelvis. No pneumoperitoneum. No abdominal or pelvic lymphadenopathy, by size criteria. No aneurysmal dilation of the major abdominal arteries. Reproductive: The uterus is unremarkable. The ovaries are unremarkable. Involuting corpus luteal cysts noted in bilateral ovaries. Other: The visualized soft tissues and abdominal wall are unremarkable. Musculoskeletal: No suspicious osseous lesions. IMPRESSION: 1. No acute inflammatory process identified within the abdomen or pelvis. 2. There is trace amount of ascites in the dependent pelvis, which is  nonspecific. 3. Multiple other nonacute observations (such as small fat containing left posteromedial diaphragmatic hernia, single calcified gallstone without acute cholecystitis, etc.), As described above. Electronically Signed   By: Jules Schick M.D.   On: 09/13/2023 11:42   DG Chest 2 View Result Date: 09/13/2023 CLINICAL DATA:  Chest pain.  Abdominal pain and extremity numbness EXAM: CHEST - 2 VIEW COMPARISON:  Chest radiograph dated 09/02/2023 FINDINGS: Normal lung volumes. No focal consolidations. No pleural effusion or pneumothorax. The heart size and mediastinal contours are within normal limits. No acute osseous abnormality. IMPRESSION: No active cardiopulmonary disease. Electronically Signed   By: Agustin Cree M.D.   On: 09/13/2023 10:30    Procedures Procedures    Medications Ordered in ED Medications  iohexol (OMNIPAQUE) 300 MG/ML solution 100 mL (100 mLs Intravenous Contrast Given 09/13/23 1113)  ketorolac (TORADOL) 15 MG/ML injection 15 mg (15 mg Intravenous Given 09/13/23 1129)    ED Course/ Medical Decision Making/ A&P Clinical Course as of 09/13/23 1227  Fri Sep 13, 2023  1008 EKG 12-Lead [CB]    Clinical Course User Index [CB] Ma Rings, PA-C                                 Medical Decision Making This patient presents to the ED for concern of blood complaints specifically suprapubic pain, left upper quadrant abdominal pain and left chest tenderness under the breast, this involves an extensive number of treatment options, and is a complaint that carries with it a high risk of complications and morbidity.  The differential diagnosis includes ACS, PE, pleurisy, pneumonia, diverticulitis, UTI, pancreatitis, other   Co morbidities that complicate the patient evaluation : Anxiety depression   Additional history obtained:  Additional history obtained from EMR External records from outside source obtained and reviewed including prior notes and labs   Lab  Tests:  I Ordered, and personally interpreted labs.  The pertinent results include: Troponin negative, lipase normal, CBC CMP normal, D-dimer is negative, UA shows no UTI   Imaging Studies ordered:  I ordered imaging studies including chest x-ray which shows pulmonary edema or infiltrate no pneumothorax; CT abdomen pelvis shows small diaphragmatic hernia, trace ascites pelvis, gallstones without signs of cholecystitis I independently visualized and interpreted imaging within scope of identifying emergent findings  I agree with the radiologist interpretation   Cardiac Monitoring: / EKG:  The patient was maintained on a cardiac monitor.  I personally viewed and interpreted the cardiac monitored which showed an underlying rhythm of: Sinus rhythm    Problem  List / ED Course / Critical interventions / Medication management   Patient here for evaluation of sharp pain under her left breast and left back.  She has any ongoing complaints with this is specifically what brought her to the ER today, is worse with breathing, she is not currently having pain, she is low risk Wells but is over age 2 so we will get D-dimer as we cannot rule her out using PERC criteria.  She is also been having some diarrhea so we will get abdominal labs as well.  Exam and vitals are overall reassuring.  Labs and EKG are reassuring as well.  Chest x-ray is normal.  D-dimer is negative.  Chest pain is reproducible.  We discussed that we rule out PE or pneumonia as a cause of this pain.  Discussed with patient and benefits of a CT scan to further evaluate the abdominal pain.  She is very concerned well has lower abdominal pain that she describes as like she is being stabbed with a knife in the suprapubic area and is worse with movement.  Discussed risk of radiation.  Benefit would be to further evaluate her abdominal pain as well as her left upper quadrant pain.  She states she would prefer to go ahead with CT.  CT result had  several incidental findings, patient has a small diaphragmatic defect is only fat-containing.  Gallstones without cholecystitis, trace pelvic ascites is nonspecific.  Discussed all these incidental findings with patient also discussed with her that she had diverticulosis though no diverticulitis.  She already has follow-up for colonoscopy as outpatient, she is seeing her PCP tomorrow at [facility].  Discussed that she can have them refer her as needed for further workup.  Her pain in her left chest wall is reproducible on palpation and movement.  Initially she had no trauma but on looking back she states she did have a significant fall several months ago in her shower off of a ladder and had bruising and this pain has been somewhat present since then, discussed it could be resulting contusion/sprain.  She was given Toradol with relief of her pain.  She is going to follow-up with gynecology regarding her pelvic pain, the CT did not show any emergent findings, trace physiologic free fluids.  She states her PCP told her she is going to refer her for likely outpatient pelvic ultrasound.  She just needs PCP to refer her so her insurance will cover this.   I ordered medication including toradol  for pain  Reevaluation of the patient after these medicines showed that the patient improved I have reviewed the patients home medicines and have made adjustments as needed   Social Determinants of Health:  Lives independently, nonsmoker, stopped ETOH in Feb 2025  I considered need for admission, at this time no indication for admission to hospital.    Amount and/or Complexity of Data Reviewed Labs: ordered. Radiology: ordered. ECG/medicine tests:  Decision-making details documented in ED Course.  Risk Prescription drug management.           Final Clinical Impression(s) / ED Diagnoses Final diagnoses:  Pleurisy  Abdominal pain, unspecified abdominal location    Rx / DC Orders ED Discharge  Orders          Ordered    lidocaine (LIDODERM) 5 %  Every 24 hours        09/13/23 1219              Ma Rings, PA-C 09/13/23 1227  Josem Kaufmann 09/13/23 1228    Terrilee Files, MD 09/14/23 [phone]

## 2023-09-13 NOTE — ED Notes (Signed)
 Pt d/c home per EDP order. Discharge summary reviewed, verbalizes understanding. NAD. Ambulatory off unit.

## 2023-09-13 NOTE — Discharge Instructions (Addendum)
 It was a pleasure taking care of you today.  You were evaluated in the ER for pain to your lower abdomen, left upper quadrant and left lower chest.  Your results showed no signs of blood clot, no signs of heart attack, no emergent findings of your CT scan of your abdomen pelvis.  You had some incidental findings including a stone in your gallbladder and trace amount of fluid in your pelvis.  Please follow-up with your PCP for these, as discussed your PCP can refer you to gynecology and I have included the number of local gynecology on your paperwork as well.  Eat a low-fat diet.  Regarding the pain in your chest, your workup was reassuring for any emergent causes, follow-up closely with your PCP but as it hurts to move or touch the area we also are prescribing topical patches to help with the pain and you can take over-the-counter Tylenol and ibuprofen as directed on packaging.  CT also showed a small fat-containing diaphragmatic hernia, at this time it does not need any specific treatment, your PCP can follow-up and place any referrals as needed   It is important to follow-up closely with your primary care doctor and/or any specialist as discussed.  Come back to the ER if you have any new or worsening symptoms.

## 2023-09-13 NOTE — ED Notes (Signed)
 Patient transported to CT

## 2023-10-03 ENCOUNTER — Encounter: Payer: Self-pay | Admitting: Physician Assistant

## 2023-10-23 ENCOUNTER — Other Ambulatory Visit (HOSPITAL_COMMUNITY)
Admission: RE | Admit: 2023-10-23 | Discharge: 2023-10-23 | Disposition: A | Discharge status: Home / Self Care | Source: Ambulatory Visit | Attending: Interventional Radiology | Admitting: Interventional Radiology

## 2023-10-23 ENCOUNTER — Ambulatory Visit
Admission: RE | Admit: 2023-10-23 | Discharge: 2023-10-23 | Disposition: A | Discharge status: Home / Self Care | Source: Ambulatory Visit | Attending: Family Medicine | Admitting: Family Medicine

## 2023-10-23 DIAGNOSIS — E041 Nontoxic single thyroid nodule: Secondary | ICD-10-CM

## 2023-10-23 NOTE — Procedures (Signed)
 Interventional Radiology Procedure Note  Procedure:   US  guided left thyroid  nodule FNA.    Complications: None Recommendations:  - Follow up path - routine wound care    Signed,  Marciano Settles. Mabel Savage, DO

## 2023-10-25 LAB — CYTOLOGY - NON PAP

## 2023-11-12 ENCOUNTER — Encounter: Payer: Self-pay | Admitting: Physician Assistant

## 2023-11-12 ENCOUNTER — Ambulatory Visit (INDEPENDENT_AMBULATORY_CARE_PROVIDER_SITE_OTHER): Admitting: Physician Assistant

## 2023-11-12 VITALS — BP 122/86 | HR 73 | Ht 66.0 in | Wt 197.6 lb

## 2023-11-12 DIAGNOSIS — K5730 Diverticulosis of large intestine without perforation or abscess without bleeding: Secondary | ICD-10-CM | POA: Diagnosis not present

## 2023-11-12 DIAGNOSIS — Z1211 Encounter for screening for malignant neoplasm of colon: Secondary | ICD-10-CM

## 2023-11-12 DIAGNOSIS — K5900 Constipation, unspecified: Secondary | ICD-10-CM

## 2023-11-12 DIAGNOSIS — K8020 Calculus of gallbladder without cholecystitis without obstruction: Secondary | ICD-10-CM

## 2023-11-12 MED ORDER — NA SULFATE-K SULFATE-MG SULF 17.5-3.13-1.6 GM/177ML PO SOLN: 1.0000 | Freq: Once | ORAL | 0 refills | Status: AC

## 2023-11-12 NOTE — Progress Notes (Signed)
 Brigitte Canard, PA-C [address] [place], [place]  [address] Phone: [phone]   Gastroenterology Consultation  Referring Provider:     [facility] Primary Care Physician:  [facility] Primary Gastroenterologist:  Brigitte Canard, PA-C / Dr. Laurell Pond  Reason for Consultation:     Diverticulosis        HPI:   Kristin Sanders is a 53 y.o. y/o female referred for consultation & management  by [facility].    New patient.  Referred to discuss diverticulosis and to schedule first screening colonoscopy.  She reports having a negative screening Cologuard test 3 or 4 years ago.  She denies any GI symptoms such as abdominal pain, rectal bleeding, or weight loss.  She has had some mild constipation which improved after increasing fiber in her diet.  No current GI symptoms.  Recent CT scan showed diverticulosis but no diverticulitis.  She has never been treated for diverticulitis in the past.  Has lots of questions about diverticulosis.  Questions were answered.  No previous colonoscopy or GI evaluation.  No family history of colon cancer.  PMH: COVID-pneumonia, anxiety, depression, hypertension  09/13/2023 abdominal pelvic CT with contrast: 1. No acute inflammatory process identified within the abdomen or pelvis. 2. There is trace amount of ascites in the dependent pelvis, which is nonspecific. 3. 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 4.  Diverticulosis, but no diverticulitis.  09/13/2023: Normal CBC (Hgb 14.9, WBC 5.6).  Normal CMP and lipase.   Past Medical History:  Diagnosis Date   Anxiety    Depression    Diverticulosis    Pneumonia due to COVID-19 virus     Past Surgical History:  Procedure Laterality Date   TONSILLECTOMY     TUBAL LIGATION      Prior to Admission medications   Medication Sig  Start Date End Date Taking? Authorizing Provider  ALPRAZolam (XANAX) 0.25 MG tablet Take 0.25 mg by mouth at bedtime as needed for anxiety. Patient not taking: Reported on 05/30/2021    [provider]  amLODipine  (NORVASC ) 10 MG tablet Take 1 tablet (10 mg total) by mouth daily. 09/25/22   Idol, Julie, PA-C  Biotin 96295 MCG TABS Take by mouth. Patient not taking: Reported on 05/30/2021    [provider]  lidocaine  (LIDODERM ) 5 % Place 1 patch onto the skin daily. Remove & Discard patch within 12 hours or as directed by MD 09/13/23   Baxter Limber A, PA-C  Multiple Vitamin (MULTIVITAMIN) capsule Take 1 capsule by mouth daily. Patient not taking: Reported on 05/30/2021    [provider]  Turmeric-Ginger 150-25 MG CHEW Chew by mouth.    [provider]    Family History  Problem Relation Age of Onset   COPD Maternal Grandmother    Alzheimer's disease Father    Hypertension Mother    Drug abuse Brother    Depression Sister    Hypertension Sister      Social History   Tobacco Use   Smoking status: Former   Smokeless tobacco: Never  Vaping Use   Vaping status: Every Day  Substance Use Topics   Alcohol use: Not Currently    Comment: occ, 12-17-2016 Per pt occa   Drug use: No    Allergies as of 11/12/2023 - Review Complete 11/12/2023  Allergen Reaction Noted   Codeine  02/09/2015  Review of Systems:    All systems reviewed and negative except where noted in HPI.   Physical Exam:  BP 122/86   Pulse 73   Ht 5\' 6"  (1.676 m)   Wt 197 lb 9.6 oz (89.6 kg)   LMP 10/17/2023 (Approximate)   BMI 31.89 kg/m  Patient's last menstrual period was 10/17/2023 (approximate).  General:   Alert,  Well-developed, well-nourished, pleasant and cooperative in NAD Lungs:  Respirations even and unlabored.  Clear throughout to auscultation.   No wheezes, crackles, or rhonchi. No acute distress. Heart:  Regular rate and rhythm; no murmurs, clicks, rubs, or  gallops. Abdomen:  Normal bowel sounds.  No bruits.  Soft, and non-distended without masses, hepatosplenomegaly or hernias noted.  No Tenderness.  No guarding or rebound tenderness.    Neurologic:  Alert and oriented x3;  grossly normal neurologically. Psych:  Alert and cooperative. Normal mood and affect.  Imaging Studies: No results found.  Labs: CBC    Component Value Date/Time   WBC 5.6 09/13/2023 1003   RBC 4.81 09/13/2023 1003   HGB 14.9 09/13/2023 1003   HCT 43.1 09/13/2023 1003   PLT 249 09/13/2023 1003   MCV 89.6 09/13/2023 1003   MCH 31.0 09/13/2023 1003   MCHC 34.6 09/13/2023 1003   RDW 11.7 09/13/2023 1003   LYMPHSABS 1.4 09/13/2023 1003   MONOABS 0.5 09/13/2023 1003   EOSABS 0.1 09/13/2023 1003   BASOSABS 0.1 09/13/2023 1003    CMP     Component Value Date/Time   NA 136 09/13/2023 1003   K 3.7 09/13/2023 1003   CL 103 09/13/2023 1003   CO2 22 09/13/2023 1003   GLUCOSE 111 (H) 09/13/2023 1003   BUN 9 09/13/2023 1003   CREATININE 0.61 09/13/2023 1003   CALCIUM 9.5 09/13/2023 1003   PROT 7.7 09/13/2023 1003   ALBUMIN 4.4 09/13/2023 1003   AST 20 09/13/2023 1003   ALT 30 09/13/2023 1003   ALKPHOS 40 09/13/2023 1003   BILITOT 1.0 09/13/2023 1003   GFRNONAA >60 09/13/2023 1003   GFRAA  09/20/2010 1417    >60        The eGFR has been calculated using the MDRD equation. This calculation has not been validated in all clinical situations. eGFR's persistently <60 mL/min signify possible Chronic Kidney Disease.    Assessment and Plan:   Kristin Sanders is a 53 y.o. y/o female has been referred for:  1.  Diverticulosis - Patient education given.  Multiple questions were answered. - Reassurance, no evidence of diverticulitis on recent CT.  No abdominal tenderness.  2.  Cholelithiasis without cholecystitis -asymptomatic - Recommend low-fat diet. - Follow-up if becomes symptomatic.  3.  Mild constipation - Continue high-fiber diet and 64 ounces of  fluids daily. - Start OTC MiraLAX powder 1 capful daily if needed.  4.  Colon cancer screening - Scheduling Colonoscopy I discussed risks of colonoscopy with patient to include risk of bleeding, colon perforation, and risk of sedation.  Patient expressed understanding and agrees to proceed with colonoscopy.   Follow up if she develops any GI symptoms.  Brigitte Canard, PA-C

## 2023-11-12 NOTE — Patient Instructions (Addendum)
 We have sent the following medications to your pharmacy for you to pick up at your convenience: suprep  For constipation: Take OTC Miralax Powder Mix 1 capful in 6 to 8 ounces of a drink once daily as Needed. Recommend high-fiber diet, 30 g of fiber daily Eat fruits, vegetables, and whole grains Drink 64 ounces of water / fluids daily.   You have been scheduled for a colonoscopy. Please follow written instructions given to you at your visit today.   If you use inhalers (even only as needed), please bring them with you on the day of your procedure.  DO NOT TAKE 7 DAYS PRIOR TO TEST- Trulicity (dulaglutide) Ozempic, Wegovy (semaglutide) Mounjaro (tirzepatide) Bydureon Bcise (exanatide extended release)  DO NOT TAKE 1 DAY PRIOR TO YOUR TEST Rybelsus (semaglutide) Adlyxin (lixisenatide) Victoza (liraglutide) Byetta (exanatide) ___________________________________________________________________________  _______________________________________________________  If your blood pressure at your visit was 140/90 or greater, please contact your primary care physician to follow up on this.  _______________________________________________________  If you are age 39 or older, your body mass index should be between 23-30. Your Body mass index is 31.89 kg/m. If this is out of the aforementioned range listed, please consider follow up with your Primary Care Provider.  If you are age 36 or younger, your body mass index should be between 19-25. Your Body mass index is 31.89 kg/m. If this is out of the aformentioned range listed, please consider follow up with your Primary Care Provider.   ________________________________________________________  The [facility] providers would like to encourage you to use MYCHART to communicate with providers for non-urgent requests or questions.  Due to long hold times on the telephone, sending your provider a message by [facility] may be a faster and more efficient  way to get a response.  Please allow 48 business hours for a response.  Please remember that this is for non-urgent requests.  _______________________________________________________

## 2023-11-14 NOTE — Progress Notes (Signed)
 Addendum: Reviewed and agree with assessment and management plan. Asha Grumbine, Carie Caddy, MD

## 2023-11-20 ENCOUNTER — Telehealth: Payer: Self-pay | Admitting: Physician Assistant

## 2023-11-20 NOTE — Telephone Encounter (Signed)
 Patient called and stated that she was wanting to know what can she do if she is not able to have high fiber in her diet and not allowed to take fiber supplement. 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 Patient is scheduled for June the 3 rd with Dr. Bridgett Camps for a colonoscopy. Patient is requesting a call back. Please advise.

## 2023-11-20 NOTE — Telephone Encounter (Signed)
 Spoke with pt and discussed with pt that she only needs to avoid the vegetables on her instructions and raw veggies. She is concerned she might get constipated. Discussed with pt that she can take miralax if that happens. Pt verbalized understanding.

## 2023-11-26 ENCOUNTER — Ambulatory Visit (AMBULATORY_SURGERY_CENTER): Admitting: Internal Medicine

## 2023-11-26 ENCOUNTER — Encounter: Payer: Self-pay | Admitting: Internal Medicine

## 2023-11-26 VITALS — BP 126/80 | HR 65 | Temp 98.0°F | Resp 10

## 2023-11-26 DIAGNOSIS — K5730 Diverticulosis of large intestine without perforation or abscess without bleeding: Secondary | ICD-10-CM | POA: Diagnosis not present

## 2023-11-26 DIAGNOSIS — Z1211 Encounter for screening for malignant neoplasm of colon: Secondary | ICD-10-CM | POA: Diagnosis present

## 2023-11-26 DIAGNOSIS — D120 Benign neoplasm of cecum: Secondary | ICD-10-CM

## 2023-11-26 MED ORDER — SODIUM CHLORIDE 0.9 % IV SOLN: 500.0000 mL | Freq: Once | INTRAVENOUS | Status: DC

## 2023-11-26 NOTE — Patient Instructions (Signed)
 Resume previous diet Continue present medications Await pathology results for colon polyps Handout on polyps given    YOU HAD AN ENDOSCOPIC PROCEDURE TODAY AT THE [facility]:   Refer to the procedure report that was given to you for any specific questions about what was found during the examination.  If the procedure report does not answer your questions, please call your gastroenterologist to clarify.  If you requested that your care partner not be given the details of your procedure findings, then the procedure report has been included in a sealed envelope for you to review at your convenience later.  YOU SHOULD EXPECT: Some feelings of bloating in the abdomen. Passage of more gas than usual.  Walking can help get rid of the air that was put into your GI tract during the procedure and reduce the bloating. If you had a lower endoscopy (such as a colonoscopy or flexible sigmoidoscopy) you may notice spotting of blood in your stool or on the toilet paper. If you underwent a bowel prep for your procedure, you may not have a normal bowel movement for a few days.  Please Note:  You might notice some irritation and congestion in your nose or some drainage.  This is from the oxygen  used during your procedure.  There is no need for concern and it should clear up in a day or so.  SYMPTOMS TO REPORT IMMEDIATELY:  Following lower endoscopy (colonoscopy or flexible sigmoidoscopy):  Excessive amounts of blood in the stool  Significant tenderness or worsening of abdominal pains  Swelling of the abdomen that is new, acute  Fever of 100F or higher    For urgent or emergent issues, a gastroenterologist can be reached at any hour by calling [phone]. Do not use MyChart messaging for urgent concerns.    DIET:  We do recommend a small meal at first, but then you may proceed to your regular diet.  Drink plenty of fluids but you should avoid alcoholic beverages for 24 hours.  ACTIVITY:   You should plan to take it easy for the rest of today and you should NOT DRIVE or use heavy machinery until tomorrow (because of the sedation medicines used during the test).    FOLLOW UP: Our staff will call the number listed on your records the next business day following your procedure.  We will call around 7:15- 8:00 am to check on you and address any questions or concerns that you may have regarding the information given to you following your procedure. If we do not reach you, we will leave a message.     If any biopsies were taken you will be contacted by phone or by letter within the next 1-3 weeks.  Please call us  at [phone] if you have not heard about the biopsies in 3 weeks.    SIGNATURES/CONFIDENTIALITY: You and/or your care partner have signed paperwork which will be entered into your electronic medical record.  These signatures attest to the fact that that the information above on your After Visit Summary has been reviewed and is understood.  Full responsibility of the confidentiality of this discharge information lies with you and/or your care-partner.

## 2023-11-26 NOTE — Op Note (Signed)
 [facility] Patient Name: Kristin Sanders Procedure Date: 11/26/2023 8:36 AM MRN: [id number] Endoscopist: Nannette Babe , MD, [id number] Age: 53 Referring MD:  Date of Birth: 1970/11/18 Gender: Female Account #: [id number] Procedure:                Colonoscopy Indications:              Screening for colorectal malignant neoplasm, This                            is the patient's first colonoscopy Medicines:                Monitored Anesthesia Care Procedure:                Pre-Anesthesia Assessment:                           - Prior to the procedure, a History and Physical                            was performed, and patient medications and                            allergies were reviewed. The patient's tolerance of                            previous anesthesia was also reviewed. The risks                            and benefits of the procedure and the sedation                            options and risks were discussed with the patient.                            All questions were answered, and informed consent                            was obtained. Prior Anticoagulants: The patient has                            taken no anticoagulant or antiplatelet agents. ASA                            Grade Assessment: II - A patient with mild systemic                            disease. After reviewing the risks and benefits,                            the patient was deemed in satisfactory condition to                            undergo the procedure.  After obtaining informed consent, the colonoscope                            was passed under direct vision. Throughout the                            procedure, the patient's blood pressure, pulse, and                            oxygen  saturations were monitored continuously. The                            CF [id number] #[id number] was introduced through the anus                            and advanced to the  cecum, identified by                            appendiceal orifice and ileocecal valve. The                            colonoscopy was performed without difficulty. The                            patient tolerated the procedure well. The quality                            of the bowel preparation was good. The ileocecal                            valve, appendiceal orifice, and rectum were                            photographed. Scope In: 8:51:17 AM Scope Out: 9:04:12 AM Scope Withdrawal Time: 0 hours 10 minutes 8 seconds  Total Procedure Duration: 0 hours 12 minutes 55 seconds  Findings:                 The digital rectal exam was normal.                           A 3 mm polyp was found in the cecum. The polyp was                            sessile. The polyp was removed with a cold snare.                            Resection and retrieval were complete.                           Multiple medium-mouthed and small-mouthed                            diverticula were found in the sigmoid colon,  descending colon, proximal transverse colon,                            hepatic flexure and ascending colon.                           The exam was otherwise without abnormality on                            direct and retroflexion views. Complications:            No immediate complications. Estimated Blood Loss:     Estimated blood loss: none. Impression:               - One 3 mm polyp in the cecum, removed with a cold                            snare. Resected and retrieved.                           - Moderate diverticulosis in the sigmoid colon, in                            the descending colon, in the proximal transverse                            colon, at the hepatic flexure and in the ascending                            colon.                           - The examination was otherwise normal on direct                            and retroflexion  views. Recommendation:           - Patient has a contact number available for                            emergencies. The signs and symptoms of potential                            delayed complications were discussed with the                            patient. Return to normal activities tomorrow.                            Written discharge instructions were provided to the                            patient.                           - Resume previous diet.                           -  Continue present medications.                           - Await pathology results.                           - Repeat colonoscopy is recommended. The                            colonoscopy date will be determined after pathology                            results from today's exam become available for                            review. Nannette Babe, MD 11/26/2023 9:06:29 AM This report has been signed electronically.

## 2023-11-26 NOTE — Progress Notes (Signed)
 Sedate, gd SR, tolerated procedure well, VSS, report to RN

## 2023-11-26 NOTE — Progress Notes (Signed)
 Pt's states no medical or surgical changes since previsit or office visit.

## 2023-11-26 NOTE — Progress Notes (Signed)
 Called to room to assist during endoscopic procedure.  Patient ID and intended procedure confirmed with present staff. Received instructions for my participation in the procedure from the performing physician.

## 2023-11-26 NOTE — Progress Notes (Signed)
 See office note dated 11/12/2023 for details and current H&P  Patient presenting for colonoscopy  She remains appropriate for colonoscopy in the [facility] today.

## 2023-11-27 ENCOUNTER — Telehealth: Payer: Self-pay

## 2023-11-27 NOTE — Telephone Encounter (Signed)
 No answer, left message to call if having any issues or concerns, B.Vale Mousseau RN

## 2023-11-28 LAB — SURGICAL PATHOLOGY

## 2023-11-29 LAB — CYTOLOGY - PAP

## 2023-12-03 ENCOUNTER — Ambulatory Visit: Payer: Self-pay | Admitting: Internal Medicine

## 2023-12-24 HISTORY — PX: COLONOSCOPY: SHX174

## 2024-03-25 ENCOUNTER — Ambulatory Visit: Admitting: Adult Health

## 2024-03-25 ENCOUNTER — Encounter: Payer: Self-pay | Admitting: Adult Health

## 2024-03-25 VITALS — BP 119/77 | HR 72 | Ht 66.0 in | Wt 205.5 lb

## 2024-03-25 DIAGNOSIS — Z1331 Encounter for screening for depression: Secondary | ICD-10-CM

## 2024-03-25 DIAGNOSIS — N9489 Other specified conditions associated with female genital organs and menstrual cycle: Secondary | ICD-10-CM

## 2024-03-25 DIAGNOSIS — N951 Menopausal and female climacteric states: Secondary | ICD-10-CM

## 2024-03-25 DIAGNOSIS — R102 Pelvic and perineal pain unspecified side: Secondary | ICD-10-CM

## 2024-03-25 NOTE — Progress Notes (Signed)
 Subjective:     Patient ID: Kristin Sanders, female   DOB: May 07, 1971, 53 y.o.   MRN: [id number]  HPI Liyah is a 53 year old white female, married, G3P3003 in complaining of pelvic pain, from right hip area down leg, has gas before periods and indigestion and mild headaches. Her periods last about 2 days and heavy both days, will change every 4-5 hours, has had some clots. She had US  03/10/24 at [facility], had right follicle cyst, and endometrium was 22 mm with 7 mm hypoechoic mass seen.  She had CPE 11/29/23 with PCP.  She is going to [facility] for MA.  Last pap was 11/29/23 negative HPV,NILM at [facility].  PCP is [facility].  Review of Systems +pelvic pain, from right hip area down leg, has gas before periods and indigestion and mild headaches. Her periods last about 2 days and heavy both days, will change every 4-5 hours, has had some clots.  She skipped a period in August and has hot flashes but better.   Reviewed past medical,surgical, social and family history. Reviewed medications and allergies.  Objective:   Physical Exam BP 119/77 (BP Location: Left Arm, Patient Position: Sitting, Cuff Size: Large)   Pulse 72   Ht 5' 6 (1.676 m)   Wt 205 lb 8 oz (93.2 kg)   LMP 03/24/2024 (Exact Date)   BMI 33.17 kg/m     Skin warm and dry.Pelvic: external genitalia is normal in appearance no lesions, vagina: +blood,urethra has no lesions or masses noted, cervix: bulbous, uterus: normal size, shape and contour, non tender, no masses felt, adnexa: no masses or tenderness noted. Bladder is non tender and no masses felt.  AA is 2 Fall risk is moderate    03/25/2024    8:53 AM 06/29/2019   10:30 AM 06/29/2019   10:29 AM  Depression screen PHQ 2/9  Decreased Interest 0 1 1  Down, Depressed, Hopeless 0 1 1  PHQ - 2 Score 0 2 2  Altered sleeping 0 1   Tired, decreased energy 1 3   Change in appetite 1 3   Feeling bad or failure about yourself  0 0   Trouble concentrating 0 0   Moving slowly or  fidgety/restless 0 0   Suicidal thoughts 0 0   PHQ-9 Score 2 9        03/25/2024    8:53 AM  GAD 7 : Generalized Anxiety Score  Nervous, Anxious, on Edge 0  Control/stop worrying 0  Worry too much - different things 0  Trouble relaxing 0  Restless 0  Easily annoyed or irritable 0  Afraid - awful might happen 0  Total GAD 7 Score 0    Upstream - 03/25/24 0848       Pregnancy Intention Screening   Does the patient want to become pregnant in the next year? No    Does the patient's partner want to become pregnant in the next year? No    Would the patient like to discuss contraceptive options today? No      Contraception Wrap Up   Current Method Female Sterilization    End Method Female Sterilization    Contraception Counseling Provided No           Examination chaperoned by Clarita Salt LPN  Assessment:     1. Pelvic pain (Primary) Has had pelvic pain from right hip and down leg Will get Pelvic US  at [facility] 03/31/24, after this period to assess Pelvic US  scheduled for  03/31/24 at 12:30 pm , will talk when results back - US  PELVIC COMPLETE WITH TRANSVAGINAL; Future  2. Peri-menopause Has skipped a period Has had some hot flashes   3. Endometrial mass Had 7 mm hypoechoic mass seen on US  03/10/24, is on period now, will get US  to see if still there, may need biopsy Pelvic US  scheduled at [facility] 03/31/24 at 12:30 pm - US  PELVIC COMPLETE WITH TRANSVAGINAL; Future     Plan:     Follow up prn

## 2024-03-31 ENCOUNTER — Ambulatory Visit (HOSPITAL_COMMUNITY)
Admission: RE | Admit: 2024-03-31 | Discharge: 2024-03-31 | Disposition: A | Discharge status: Home / Self Care | Source: Ambulatory Visit | Attending: Adult Health | Admitting: Adult Health

## 2024-03-31 DIAGNOSIS — N9489 Other specified conditions associated with female genital organs and menstrual cycle: Secondary | ICD-10-CM | POA: Insufficient documentation

## 2024-03-31 DIAGNOSIS — R102 Pelvic and perineal pain unspecified side: Secondary | ICD-10-CM | POA: Insufficient documentation

## 2024-04-03 ENCOUNTER — Ambulatory Visit (HOSPITAL_BASED_OUTPATIENT_CLINIC_OR_DEPARTMENT_OTHER): Admitting: Orthopaedic Surgery

## 2024-04-07 ENCOUNTER — Ambulatory Visit: Payer: Self-pay | Admitting: Adult Health

## 2024-04-09 ENCOUNTER — Ambulatory Visit: Admitting: Gastroenterology

## 2024-04-27 ENCOUNTER — Encounter: Payer: Self-pay | Admitting: Radiology

## 2024-07-01 ENCOUNTER — Encounter (HOSPITAL_COMMUNITY): Payer: Self-pay

## 2024-07-01 ENCOUNTER — Emergency Department (HOSPITAL_COMMUNITY)
Admission: EM | Admit: 2024-07-01 | Discharge: 2024-07-01 | Disposition: A | Discharge status: Home / Self Care | Payer: Self-pay | Attending: Emergency Medicine | Admitting: Emergency Medicine

## 2024-07-01 ENCOUNTER — Other Ambulatory Visit: Payer: Self-pay

## 2024-07-01 DIAGNOSIS — N3090 Cystitis, unspecified without hematuria: Secondary | ICD-10-CM | POA: Insufficient documentation

## 2024-07-01 LAB — URINALYSIS, W/ REFLEX TO CULTURE (INFECTION SUSPECTED)
Bilirubin Urine: NEGATIVE
Glucose, UA: NEGATIVE mg/dL
Ketones, ur: NEGATIVE mg/dL
Nitrite: NEGATIVE
Protein, ur: 100 mg/dL — AB
Specific Gravity, Urine: 1.006 (ref 1.005–1.030)
WBC, UA: >50 WBC/hpf (ref 0–5)
pH: 6 (ref 5.0–8.0)

## 2024-07-01 MED ORDER — CEPHALEXIN 500 MG PO CAPS
500.0000 mg | ORAL_CAPSULE | Freq: Once | ORAL | Status: AC
  Administered 2024-07-01: 500 mg via ORAL
  Filled 2024-07-01: qty 1

## 2024-07-01 MED ORDER — CEPHALEXIN 500 MG PO CAPS: 500.0000 mg | ORAL_CAPSULE | Freq: Four times a day (QID) | ORAL | 0 refills | Status: AC

## 2024-07-01 NOTE — ED Triage Notes (Signed)
 Pt reports, frequency, urgency and mild burning with urination.  Pt reports started AZO and cranberry juice last night and took tylenol.

## 2024-07-01 NOTE — ED Provider Notes (Signed)
 " [place] EMERGENCY DEPARTMENT AT [facility] Provider Note   CSN: [id number] Arrival date & time: 07/01/24  2134     Patient presents with: Dysuria   Kristin Sanders is a 54 y.o. female.    Dysuria Patient presents for dysuria.  Medical history includes anxiety, depression, OSA.  Yesterday, he had dysuria, frequency, and suprapubic pressure.  She took Azo and cranberry juice with some relief this morning.  Symptoms recurred later today.  She denies any systemic symptoms.     Prior to Admission medications  Medication Sig Start Date End Date Taking? Authorizing Provider  cephALEXin  (KEFLEX ) 500 MG capsule Take 1 capsule (500 mg total) by mouth 4 (four) times daily. 07/01/24  Yes Melvenia Motto, MD  albuterol  (VENTOLIN  HFA) [phone] Base) MCG/ACT inhaler Inhale into the lungs. 02/21/24   [provider]  ALPRAZolam (XANAX) 1 MG tablet Take 1 mg by mouth 2 (two) times daily. 02/20/24   [provider]  escitalopram  (LEXAPRO ) 5 MG tablet Take 1 tablet by mouth daily.    [provider]  Turmeric-Ginger 150-25 MG CHEW Chew by mouth.    [provider]    Allergies: Codeine    Review of Systems  Genitourinary:  Positive for dysuria, frequency and urgency.  All other systems reviewed and are negative.   Updated Vital Signs BP 129/88 (BP Location: Right Arm)   Pulse (!) 106   Temp 99.5 F (37.5 C)   Resp 16   Wt 83.9 kg   SpO2 96%   BMI 29.86 kg/m   Physical Exam Vitals and nursing note reviewed.  Constitutional:      General: She is not in acute distress.    Appearance: Normal appearance. She is well-developed. She is not ill-appearing, toxic-appearing or diaphoretic.  HENT:     Head: Normocephalic and atraumatic.     Right Ear: External ear normal.     Left Ear: External ear normal.     Nose: Nose normal.     Mouth/Throat:     Mouth: Mucous membranes are moist.  Eyes:     Extraocular Movements: Extraocular movements intact.      Conjunctiva/sclera: Conjunctivae normal.  Cardiovascular:     Rate and Rhythm: Normal rate and regular rhythm.  Pulmonary:     Effort: Pulmonary effort is normal. No respiratory distress.  Abdominal:     General: There is no distension.     Palpations: Abdomen is soft.  Musculoskeletal:        General: No swelling. Normal range of motion.     Cervical back: Normal range of motion and neck supple.  Skin:    General: Skin is warm and dry.     Coloration: Skin is not jaundiced or pale.  Neurological:     General: No focal deficit present.     Mental Status: She is alert and oriented to person, place, and time.  Psychiatric:        Mood and Affect: Mood normal.        Behavior: Behavior normal.     (all labs ordered are listed, but only abnormal results are displayed) Labs Reviewed  URINALYSIS, W/ REFLEX TO CULTURE (INFECTION SUSPECTED) - Abnormal; Notable for the following components:      Result Value   APPearance CLOUDY (*)    Hgb urine dipstick LARGE (*)    Protein, ur 100 (*)    Leukocytes,Ua LARGE (*)    Bacteria, UA RARE (*)  All other components within normal limits  URINE CULTURE    EKG: None  Radiology: No results found.   Procedures   Medications Ordered in the ED  cephALEXin  (KEFLEX ) capsule 500 mg (has no administration in time range)                                    Medical Decision Making Amount and/or Complexity of Data Reviewed Labs: ordered.  Risk Prescription drug management.   Patient presents for dysuria, frequency, and urgency since last night.  She had mild leaf of symptoms with Azo and cranberry juice.  She describes a suprapubic pressure but denies any other areas of discomfort.  On exam, she is well-appearing.  She denies any recent systemic symptoms.  History is consistent with cystitis.  Patient was able to provide urine sample which was notable for cloudiness in appearance.  Urinalysis shows bacteriuria and pyuria.  Patient  was given initial dose of antibiotics in the emergency department.  Urine culture was sent.  Patient was discharged in stable condition.     Final diagnoses:  Cystitis    ED Discharge Orders          Ordered    cephALEXin  (KEFLEX ) 500 MG capsule  4 times daily        07/01/24 2305               Melvenia Motto, MD 07/01/24 2305  "

## 2024-07-01 NOTE — Discharge Instructions (Signed)
 A prescription for ongoing antibiotics was sent to your pharmacy.  Take as prescribed for the next 5 days.  Take ibuprofen and Tylenol as needed for discomfort.  Return to the emergency department for any new or worsening symptoms of concern.

## 2024-07-04 LAB — URINE CULTURE: Culture: >=100000 — AB

## 2024-07-05 ENCOUNTER — Telehealth (HOSPITAL_BASED_OUTPATIENT_CLINIC_OR_DEPARTMENT_OTHER): Payer: Self-pay | Admitting: *Deleted

## 2024-07-05 NOTE — Telephone Encounter (Signed)
 Post ED Visit - Positive Culture Follow-up  Culture report reviewed by antimicrobial stewardship pharmacist: [facility] []  [place], Pharm.D. []  Venetia Gully, Pharm.D., BCPS AQ-ID []  Garrel Crews, Pharm.D., BCPS []  Almarie Lunger, [address].D., BCPS []  [place], [address].D., BCPS, AAHIVP []  Rosaline Bihari, Pharm.D., BCPS, AAHIVP []  Vernell Meier, PharmD, BCPS []  Latanya Hint, PharmD, BCPS []  Donald Medley, PharmD, BCPS []  Rocky Bold, PharmD []  Dorothyann Alert, PharmD, BCPS [x]  Dorn Poot,  PharmD  [facility] []  Rosaline Edison, PharmD []  Romona Bliss, PharmD []  Dolphus Roller, PharmD []  Veva Seip, Rph []  Vernell Daunt) Leonce, PharmD []  Eva Allis, PharmD []  Rosaline Millet, PharmD []  Iantha Batch, PharmD []  Arvin Gauss, PharmD []  Wanda Hasting, PharmD []  Ronal Rav, PharmD []  Rocky Slade, PharmD []  Bard Jeans, PharmD   Positive urine culture Treated with Cephalexin , organism sensitive to the same and no further patient follow-up is required at this time.  Kristin Sanders 07/05/2024, 10:48 AM
